# Patient Record
Sex: Female | Born: 2000 | Race: White | Hispanic: No | Marital: Married | Smoking: Never smoker
Health system: Southern US, Community
[De-identification: ages and names within clinical notes are randomized; demographics above are authoritative.]

## PROBLEM LIST (undated history)

## (undated) ENCOUNTER — Emergency Department

## (undated) DIAGNOSIS — J45909 Unspecified asthma, uncomplicated: Secondary | ICD-10-CM

## (undated) DIAGNOSIS — G4700 Insomnia, unspecified: Secondary | ICD-10-CM

## (undated) DIAGNOSIS — N83209 Unspecified ovarian cyst, unspecified side: Secondary | ICD-10-CM

## (undated) HISTORY — PX: TONSILLECTOMY: SUR1361

---
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
## 2018-02-03 ENCOUNTER — Other Ambulatory Visit: Payer: Self-pay

## 2018-02-03 ENCOUNTER — Emergency Department
Admission: EM | Admit: 2018-02-03 | Discharge: 2018-02-03 | Disposition: A | Discharge status: Home / Self Care | Payer: Medicaid Other | Attending: Emergency Medicine | Admitting: Emergency Medicine

## 2018-02-03 DIAGNOSIS — W2201XA Walked into wall, initial encounter: Secondary | ICD-10-CM | POA: Insufficient documentation

## 2018-02-03 DIAGNOSIS — Y990 Civilian activity done for income or pay: Secondary | ICD-10-CM | POA: Insufficient documentation

## 2018-02-03 DIAGNOSIS — S0093XA Contusion of unspecified part of head, initial encounter: Secondary | ICD-10-CM | POA: Insufficient documentation

## 2018-02-03 DIAGNOSIS — S0990XA Unspecified injury of head, initial encounter: Secondary | ICD-10-CM | POA: Diagnosis present

## 2018-02-03 DIAGNOSIS — Y929 Unspecified place or not applicable: Secondary | ICD-10-CM | POA: Diagnosis not present

## 2018-02-03 DIAGNOSIS — Y939 Activity, unspecified: Secondary | ICD-10-CM | POA: Insufficient documentation

## 2018-02-03 MED ORDER — KETOROLAC TROMETHAMINE 30 MG/ML IJ SOLN
30.0000 mg | Freq: Once | INTRAMUSCULAR | Status: AC
  Administered 2018-02-03: 30 mg via INTRAMUSCULAR
  Filled 2018-02-03: qty 1

## 2018-02-03 MED ORDER — KETOROLAC TROMETHAMINE 10 MG PO TABS: 10.0000 mg | ORAL_TABLET | Freq: Four times a day (QID) | ORAL | 0 refills | Status: AC | PRN

## 2018-02-03 NOTE — ED Provider Notes (Signed)
[facility] Emergency Department Provider Note  ____________________________________________  Time seen: Approximately 4:38 PM  I have reviewed the triage vital signs and the nursing notes.   HISTORY  Chief Complaint Head Injury   Historian Mother    HPI Destiny [name] is a 17 y.o. female presents to the emergency department with residual headache after patient reports that she hit her head against a brick wall yesterday at work.  Patient reports that she was walking when she tripped against a gutter and struck a wall.  Patient did not lose consciousness and is not complaining of neck pain.  Patient sustained no scalp abrasions or lacerations.  Patient has been ambulating without difficulty.  She denies any blurry vision, nausea, vomiting, disorientation or confusion.  Patient is accompanied by her mother who has noticed no changes in behavior.  Patient reports that she has had a headache since the incident that improves if not resolves with Tylenol.  Patient became concerned because when Tylenol wears off, headache seems to return.  Patient reports that headache is not the worst headache of her life.   Past Medical History:  Diagnosis Date  . Asthma   . Insomnia   . Ovarian cyst      Immunizations up to date:  Yes.     Past Medical History:  Diagnosis Date  . Asthma   . Insomnia   . Ovarian cyst     There are no active problems to display for this patient.   Past Surgical History:  Procedure Laterality Date  . TONSILLECTOMY      Prior to Admission medications   Medication Sig Start Date End Date Taking? Authorizing Provider  ketorolac (TORADOL) 10 MG tablet Take 1 tablet (10 mg total) by mouth every 6 (six) hours as needed for up to 5 days. 02/03/18 02/08/18  Orvil Feil, PA-C  meloxicam (MOBIC) 15 MG tablet Take 1 tablet (15 mg total) by mouth daily. 08/29/17   Cuthriell, Delorise Royals, PA-C  methocarbamol (ROBAXIN) 500 MG tablet Take 1 tablet  (500 mg total) by mouth 4 (four) times daily. 08/29/17   Cuthriell, Delorise Royals, PA-C  zolpidem (AMBIEN) 5 MG tablet Take 10 mg by mouth at bedtime as needed for sleep.    [provider]    Allergies Zofran [ondansetron hcl]  No family history on file.  Social History Social History   Tobacco Use  . Smoking status: Passive Smoke Exposure - Never Smoker  . Smokeless tobacco: Never Used  Substance Use Topics  . Alcohol use: No  . Drug use: No     Review of Systems  Constitutional: No fever/chills Eyes:  No discharge ENT: No upper respiratory complaints. Respiratory: no cough. No SOB/ use of accessory muscles to breath Gastrointestinal:   No nausea, no vomiting.  No diarrhea.  No constipation. Musculoskeletal: Negative for musculoskeletal pain. Skin: Negative for rash, abrasions, lacerations, ecchymosis.    ____________________________________________   PHYSICAL EXAM:  VITAL SIGNS: ED Triage Vitals  Enc Vitals Group     BP 02/03/18 1543 140/82     Pulse Rate 02/03/18 1543 85     Resp 02/03/18 1543 18     Temp 02/03/18 1543 98.3 F (36.8 C)     Temp Source 02/03/18 1543 Oral     SpO2 02/03/18 1543 95 %     Weight 02/03/18 1545 163 lb (73.9 kg)     Height 02/03/18 1545  (1.676 m)     Head Circumference --  Peak Flow --      Pain Score 02/03/18 1544 3     Pain Loc --      Pain Edu? --      Excl. in GC? --      Constitutional: Alert and oriented. Well appearing and in no acute distress. Eyes: Conjunctivae are normal. PERRL. EOMI. Head: Atraumatic.  No abrasions or scalp lacerations.  No palpable focal edema of the scalp appreciated. ENT:      Ears: TMs are pearly.      Nose: No congestion/rhinnorhea.      Mouth/Throat: Mucous membranes are moist.  Neck: No stridor.  No cervical spine tenderness to palpation.  Cardiovascular: Normal rate, regular rhythm. Normal S1 and S2.  Good peripheral circulation. Respiratory: Normal respiratory effort  without tachypnea or retractions. Lungs CTAB. Good air entry to the bases with no decreased or absent breath sounds Musculoskeletal: Full range of motion to all extremities. No obvious deformities noted Neurologic:  Normal for age. No gross focal neurologic deficits are appreciated.  Skin:  Skin is warm, dry and intact. No rash noted.  ____________________________________________   LABS (all labs ordered are listed, but only abnormal results are displayed)  Labs Reviewed - No data to display ____________________________________________  EKG   ____________________________________________  RADIOLOGY   No results found.  ____________________________________________    PROCEDURES  Procedure(s) performed:     Procedures     Medications  ketorolac (TORADOL) 30 MG/ML injection 30 mg (has no administration in time range)     ____________________________________________   INITIAL IMPRESSION / ASSESSMENT AND PLAN / ED COURSE  Pertinent labs & imaging results that were available during my care of the patient were reviewed by me and considered in my medical decision making (see chart for details).     Assessment and plan Fall Patient presents to the emergency department after tripping and hitting her head against a brick wall.  Neurologic exam was completely reassuring.  CT head is not warranted at this time.  Strict return precautions were given to return to the emergency department with excruciating headache, nausea, vomiting, new blurry vision or disorientation.  Patient's mother voiced understanding.  Patient received an injection of Toradol in the emergency department after she denies possibility of pregnancy.  She was discharged with Toradol.  Vital signs are reassuring prior to discharge.    ____________________________________________  FINAL CLINICAL IMPRESSION(S) / ED DIAGNOSES  Final diagnoses:  Contusion of head, unspecified part of head, initial  encounter      NEW MEDICATIONS STARTED DURING THIS VISIT:  ED Discharge Orders        Ordered    ketorolac (TORADOL) 10 MG tablet  Every 6 hours PRN     02/03/18 1635          This chart was dictated using voice recognition software/Dragon. Despite best efforts to proofread, errors can occur which can change the meaning. Any change was purely unintentional.     Orvil Feil, PA-C 02/03/18 1644    Don Perking, [place], MD 02/06/18 [phone]

## 2018-02-03 NOTE — ED Notes (Signed)
See triage note  States she fell yesterday   Hit head on brick wall  No loc but states she was stunned  conts to have headache  Neuro intact

## 2018-02-03 NOTE — ED Notes (Signed)
Pt ambulatory to POV without difficulty, VSS, NAD. Discharge instruction, RX, follow up reviewed. All questions addressed.

## 2018-02-03 NOTE — ED Triage Notes (Signed)
Pt fell, hit head on brick wall yesterday while at work. Headache since. No LOC. Pt alert and oriented X4, active, cooperative, pt in NAD. RR even and unlabored, color WNL.

## 2018-03-22 ENCOUNTER — Emergency Department: Payer: Medicaid Other

## 2018-03-22 ENCOUNTER — Emergency Department
Admission: EM | Admit: 2018-03-22 | Discharge: 2018-03-22 | Disposition: A | Discharge status: Home / Self Care | Payer: Medicaid Other | Attending: Emergency Medicine | Admitting: Emergency Medicine

## 2018-03-22 ENCOUNTER — Other Ambulatory Visit: Payer: Self-pay

## 2018-03-22 DIAGNOSIS — J45909 Unspecified asthma, uncomplicated: Secondary | ICD-10-CM | POA: Diagnosis not present

## 2018-03-22 DIAGNOSIS — R079 Chest pain, unspecified: Secondary | ICD-10-CM | POA: Insufficient documentation

## 2018-03-22 DIAGNOSIS — F4320 Adjustment disorder, unspecified: Secondary | ICD-10-CM | POA: Insufficient documentation

## 2018-03-22 DIAGNOSIS — Z7722 Contact with and (suspected) exposure to environmental tobacco smoke (acute) (chronic): Secondary | ICD-10-CM | POA: Diagnosis not present

## 2018-03-22 DIAGNOSIS — F419 Anxiety disorder, unspecified: Secondary | ICD-10-CM

## 2018-03-22 DIAGNOSIS — F4321 Adjustment disorder with depressed mood: Secondary | ICD-10-CM

## 2018-03-22 MED ORDER — LORAZEPAM 1 MG PO TABS: 1.0000 mg | ORAL_TABLET | Freq: Two times a day (BID) | ORAL | 0 refills | Status: DC

## 2018-03-22 MED ORDER — DIAZEPAM 5 MG PO TABS
10.0000 mg | ORAL_TABLET | Freq: Once | ORAL | Status: AC
  Administered 2018-03-22: 10 mg via ORAL
  Filled 2018-03-22: qty 2

## 2018-03-22 NOTE — ED Provider Notes (Addendum)
[facility] Emergency Department Provider Note       Time seen: ----------------------------------------- 9:29 AM on 03/22/2018 -----------------------------------------   I have reviewed the triage vital signs and the nursing notes.  HISTORY   Chief Complaint Chest Pain    HPI Destiny [name] is a 17 y.o. female with a history of asthma, insomnia and ovarian cysts who presents to the ED for chest pain in the center of her chest for the last 4 days.  She has chest tightness but no cough.  She denies any recent illness, fevers, chills or other complaints.  She states she lost her grandmother in April and another family member 4 days ago.  Past Medical History:  Diagnosis Date  . Asthma   . Insomnia   . Ovarian cyst     There are no active problems to display for this patient.   Past Surgical History:  Procedure Laterality Date  . TONSILLECTOMY      Allergies Zofran Frazier Richards hcl]  Social History Social History   Tobacco Use  . Smoking status: Passive Smoke Exposure - Never Smoker  . Smokeless tobacco: Never Used  Substance Use Topics  . Alcohol use: No  . Drug use: No   Review of Systems Constitutional: Negative for fever. Cardiovascular: Positive for chest pain Respiratory: Negative for shortness of breath.  Negative for cough Gastrointestinal: Negative for abdominal pain, vomiting and diarrhea. Musculoskeletal: Negative for back pain. Skin: Negative for rash. Neurological: Negative for headaches, focal weakness or numbness. Psychiatric: Positive for anxiety and stress  All systems negative/normal/unremarkable except as stated in the HPI  ____________________________________________   PHYSICAL EXAM:  VITAL SIGNS: ED Triage Vitals [03/22/18 0919]  Enc Vitals Group     BP (!) 155/83     Pulse Rate 96     Resp 14     Temp 99.1 F (37.3 C)     Temp Source Oral     SpO2 98 %     Weight 161 lb (73 kg)     Height 5\' 5"   (1.651 m)     Head Circumference      Peak Flow      Pain Score 6     Pain Loc      Pain Edu?      Excl. in GC?    Constitutional: Alert and oriented. Well appearing and in no distress. Eyes: Conjunctivae are normal. Normal extraocular movements. Cardiovascular: Normal rate, regular rhythm. No murmurs, rubs, or gallops. Respiratory: Normal respiratory effort without tachypnea nor retractions. Breath sounds are clear and equal bilaterally. No wheezes/rales/rhonchi. Gastrointestinal: Soft and nontender. Normal bowel sounds Musculoskeletal: Nontender with normal range of motion in extremities. No lower extremity tenderness nor edema. Neurologic:  Normal speech and language. No gross focal neurologic deficits are appreciated.  Skin:  Skin is warm, dry and intact. No rash noted. Psychiatric: Depressed mood and affect, patient is tearful ____________________________________________  EKG: Interpreted by me.  Sinus rhythm the rate of 100 bpm, normal PR interval, normal QRS, normal QT  ____________________________________________  ED COURSE:  As part of my medical decision making, I reviewed the following data within the electronic MEDICAL RECORD NUMBER History obtained from family if available, nursing notes, old chart and ekg, as well as notes from prior ED visits. Patient presented for chest pain, we will assess with labs and imaging as indicated at this time.   Procedures ____________________________________________   RADIOLOGY Images were viewed by me  Chest x-ray Is unremarkable ____________________________________________  DIFFERENTIAL DIAGNOSIS   Asthma, anxiety, acute grief reaction, GERD  FINAL ASSESSMENT AND PLAN  Chest pain   Plan: The patient had presented for chest pain. Patient's imaging did not reveal any acute process.  This appears to be a grief reaction and she was given p.o. Valium here.  She will be discharged with a short supply of similar benzodiazepine.  She  is cleared for outpatient follow-up but does need to see a counselor soon.   Ulice DashJohnathan E Darria Corvera, MD   Note: This note was generated in part or whole with voice recognition software. Voice recognition is usually quite accurate but there are transcription errors that can and very often do occur. I apologize for any typographical errors that were not detected and corrected.     Emily FilbertWilliams, Bria Sparr E, MD 03/22/18 [id number]    Emily FilbertWilliams, Gates Jividen E, MD 03/22/18 [phone]

## 2018-03-22 NOTE — ED Triage Notes (Signed)
Chest pain to center of chest X 4 days, tightness and cough. Denies cough or recent sickness. Pt alert and oriented X4, active, cooperative, pt in NAD. RR even and unlabored, color WNL.

## 2018-03-22 NOTE — ED Notes (Signed)
Pt taken to Xray at this time.

## 2018-06-13 ENCOUNTER — Other Ambulatory Visit: Payer: Self-pay

## 2018-06-13 ENCOUNTER — Encounter: Payer: Self-pay | Admitting: Emergency Medicine

## 2018-06-13 ENCOUNTER — Emergency Department
Admission: EM | Admit: 2018-06-13 | Discharge: 2018-06-13 | Disposition: A | Discharge status: Home / Self Care | Payer: Medicaid Other | Attending: Emergency Medicine | Admitting: Emergency Medicine

## 2018-06-13 DIAGNOSIS — Z79899 Other long term (current) drug therapy: Secondary | ICD-10-CM | POA: Diagnosis not present

## 2018-06-13 DIAGNOSIS — F32A Depression, unspecified: Secondary | ICD-10-CM

## 2018-06-13 DIAGNOSIS — F1290 Cannabis use, unspecified, uncomplicated: Secondary | ICD-10-CM | POA: Diagnosis not present

## 2018-06-13 DIAGNOSIS — J45909 Unspecified asthma, uncomplicated: Secondary | ICD-10-CM | POA: Diagnosis not present

## 2018-06-13 DIAGNOSIS — Z7722 Contact with and (suspected) exposure to environmental tobacco smoke (acute) (chronic): Secondary | ICD-10-CM | POA: Diagnosis not present

## 2018-06-13 DIAGNOSIS — F329 Major depressive disorder, single episode, unspecified: Secondary | ICD-10-CM | POA: Diagnosis not present

## 2018-06-13 LAB — URINALYSIS, COMPLETE (UACMP) WITH MICROSCOPIC
BILIRUBIN URINE: NEGATIVE
GLUCOSE, UA: NEGATIVE mg/dL
Hgb urine dipstick: NEGATIVE
KETONES UR: NEGATIVE mg/dL
Leukocytes, UA: NEGATIVE
NITRITE: NEGATIVE
PH: 6 (ref 5.0–8.0)
Protein, ur: NEGATIVE mg/dL
Specific Gravity, Urine: 1.026 (ref 1.005–1.030)

## 2018-06-13 LAB — POCT PREGNANCY, URINE: Preg Test, Ur: NEGATIVE

## 2018-06-13 NOTE — Discharge Instructions (Signed)
You are suffering from depression from complicated reading.  This is not unnatural, but you should seek help with a counselor.  If you have any thoughts of hurting herself or anyone else as we discussed please return immediately to medical attention.  Otherwise, be aware that usually would benefit from seeing a professional about this.  We have advised to do some places to follow-up with your primary care doctor can also help with this.  We have also had our counselor gave you some resources.  If you have any other concerns return to the emergency room

## 2018-06-13 NOTE — ED Triage Notes (Signed)
Arrives c/o "feeling weak and not being able to sleep well".  States symptoms have been ongoing for months.  AAOx3.  Skin warm and dry. NAD

## 2018-06-13 NOTE — ED Notes (Signed)
Pt reports frequent headaches and dizziness. Pt reports starting a new medication, Ambien, for her sleeplessness that is causing her to feel weak.

## 2018-06-13 NOTE — ED Provider Notes (Signed)
[facility] Emergency Department Provider Note  ____________________________________________   I have reviewed the triage vital signs and the nursing notes. Where available I have reviewed prior notes and, if possible and indicated, outside hospital notes.    HISTORY  Chief Complaint Weakness    HPI Destiny [name] is a 17 y.o. female who presents today complaining of depression.  She states she has easy tearful moments, difficulty sleeping, and anhedonia and trouble focusing since her grandmother died unexpectedly in this.  She was seen here for grief reaction shortly thereafter.  She has no SI or HI she is not being abused she is with family she is not taking it or abusing any drugs does have occasional marijuana she does not drink, she has no thoughts of hurting herself or anyone else.  She thinks however that she needs some help with her depression.  Her girlfriend who is with her agrees.  Patient did give consent to talk about her problems in front of her girlfriend.  Most of patient's history was taken up by very intimate details of the patient's grandmother's medical history in the last year of her life with multiple hospitalizations and medical information.     Past Medical History:  Diagnosis Date  . Asthma   . Insomnia   . Ovarian cyst     There are no active problems to display for this patient.   Past Surgical History:  Procedure Laterality Date  . TONSILLECTOMY      Prior to Admission medications   Medication Sig Start Date End Date Taking? Authorizing Provider  etonogestrel (NEXPLANON) 68 MG IMPL implant 1 each by Subdermal route once.    [provider]  LORazepam (ATIVAN) 1 MG tablet Take 1 tablet (1 mg total) by mouth 2 (two) times daily. 03/22/18 03/22/19  Emily Filbert, MD  meloxicam (MOBIC) 15 MG tablet Take 1 tablet (15 mg total) by mouth daily. Patient not taking: Reported on 03/22/2018 08/29/17   Cuthriell, Delorise Royals, PA-C   methocarbamol (ROBAXIN) 500 MG tablet Take 1 tablet (500 mg total) by mouth 4 (four) times daily. Patient not taking: Reported on 03/22/2018 08/29/17   Cuthriell, Delorise Royals, PA-C    Allergies Zofran Frazier Richards hcl]  No family history on file.  Social History Social History   Tobacco Use  . Smoking status: Passive Smoke Exposure - Never Smoker  . Smokeless tobacco: Never Used  Substance Use Topics  . Alcohol use: No  . Drug use: No    Review of Systems Constitutional: No fever/chills Eyes: No visual changes. ENT: No sore throat. No stiff neck no neck pain Cardiovascular: Denies chest pain. Respiratory: Denies shortness of breath. Gastrointestinal:   no vomiting.  No diarrhea.  No constipation. Genitourinary: Negative for dysuria. Musculoskeletal: Negative lower extremity swelling Skin: Negative for rash. Neurological: Negative for severe headaches, focal weakness or numbness.   ____________________________________________   PHYSICAL EXAM:  VITAL SIGNS: ED Triage Vitals  Enc Vitals Group     BP 06/13/18 1358 129/79     Pulse Rate 06/13/18 1358 95     Resp 06/13/18 1358 16     Temp 06/13/18 1358 98.6 F (37 C)     Temp Source 06/13/18 1358 Oral     SpO2 06/13/18 1358 97 %     Weight 06/13/18 1356 163 lb (73.9 kg)     Height 06/13/18 1356 5' 5.5" (1.664 m)     Head Circumference --      Peak Flow --  Pain Score 06/13/18 1356 0     Pain Loc --      Pain Edu? --      Excl. in GC? --     Constitutional: Alert and oriented. Well appearing and in no acute distress. Eyes: Conjunctivae are normal Head: Atraumatic HEENT: No congestion/rhinnorhea. Mucous membranes are moist.  Oropharynx non-erythematous Neck:   Nontender with no meningismus, no masses, no stridor Cardiovascular: Normal rate, regular rhythm. Grossly normal heart sounds.  Good peripheral circulation. Respiratory: Normal respiratory effort.  No retractions. Lungs CTAB. Abdominal: Soft and  nontender. No distention. No guarding no rebound Back:  There is no focal tenderness or step off.  there is no midline tenderness there are no lesions noted. there is no CVA tenderness Musculoskeletal: No lower extremity tenderness, no upper extremity tenderness. No joint effusions, no DVT signs strong distal pulses no edema Neurologic:  Normal speech and language. No gross focal neurologic deficits are appreciated.  Skin:  Skin is warm, dry and intact. No rash noted. Psychiatric: Mood and affect are somewhat depressed although able to have laughter and changing moods. Speech and behavior are normal.  ____________________________________________   LABS (all labs ordered are listed, but only abnormal results are displayed)  Labs Reviewed  URINALYSIS, COMPLETE (UACMP) WITH MICROSCOPIC - Abnormal; Notable for the following components:      Result Value   Color, Urine YELLOW (*)    APPearance CLEAR (*)    Bacteria, UA RARE (*)    All other components within normal limits  POCT PREGNANCY, URINE  POC URINE PREG, ED    Pertinent labs  results that were available during my care of the patient were reviewed by me and considered in my medical decision making (see chart for details). ____________________________________________  EKG  I personally interpreted any EKGs ordered by me or triage  ____________________________________________  RADIOLOGY  Pertinent labs & imaging results that were available during my care of the patient were reviewed by me and considered in my medical decision making (see chart for details). If possible, patient and/or family made aware of any abnormal findings.  No results found. ____________________________________________    PROCEDURES  Procedure(s) performed: None  Procedures  Critical Care performed: None  ____________________________________________   INITIAL IMPRESSION / ASSESSMENT AND PLAN / ED COURSE  Pertinent labs & imaging results that  were available during my care of the patient were reviewed by me and considered in my medical decision making (see chart for details).  Patient with signs and symptoms of depression medically speaking I do not see any acute injury or evidence of Janak disease at this time, such as thyroid etc.  However, patient does have evidence of depression.  She has no SI or HI, she does require outpatient follow-up, she is not being abused she is not abusing drugs or alcohol to any significant extent.  I have counseled her about marijuana use.  I will have the TTS counselors give her resources.  I have offered her condolences in consultation to the extent that I can hear.  Prolonged adjustment reaction    ____________________________________________   FINAL CLINICAL IMPRESSION(S) / ED DIAGNOSES  Final diagnoses:  None      This chart was dictated using voice recognition software.  Despite best efforts to proofread,  errors can occur which can change meaning.      Jeanmarie PlantMcShane, Tarren Sabree A, MD 06/13/18 [phone]

## 2018-06-15 ENCOUNTER — Emergency Department
Admission: EM | Admit: 2018-06-15 | Discharge: 2018-06-15 | Disposition: A | Discharge status: Home / Self Care | Payer: Medicaid Other | Attending: Emergency Medicine | Admitting: Emergency Medicine

## 2018-06-15 ENCOUNTER — Other Ambulatory Visit: Payer: Self-pay

## 2018-06-15 ENCOUNTER — Encounter: Payer: Self-pay | Admitting: Emergency Medicine

## 2018-06-15 DIAGNOSIS — R5383 Other fatigue: Secondary | ICD-10-CM | POA: Diagnosis present

## 2018-06-15 DIAGNOSIS — F1210 Cannabis abuse, uncomplicated: Secondary | ICD-10-CM | POA: Insufficient documentation

## 2018-06-15 DIAGNOSIS — Z7722 Contact with and (suspected) exposure to environmental tobacco smoke (acute) (chronic): Secondary | ICD-10-CM | POA: Insufficient documentation

## 2018-06-15 DIAGNOSIS — F1290 Cannabis use, unspecified, uncomplicated: Secondary | ICD-10-CM

## 2018-06-15 DIAGNOSIS — J45909 Unspecified asthma, uncomplicated: Secondary | ICD-10-CM | POA: Insufficient documentation

## 2018-06-15 DIAGNOSIS — Z79899 Other long term (current) drug therapy: Secondary | ICD-10-CM | POA: Insufficient documentation

## 2018-06-15 DIAGNOSIS — Z634 Disappearance and death of family member: Secondary | ICD-10-CM | POA: Diagnosis not present

## 2018-06-15 DIAGNOSIS — F4321 Adjustment disorder with depressed mood: Secondary | ICD-10-CM

## 2018-06-15 LAB — URINE DRUG SCREEN, QUALITATIVE (ARMC ONLY)
Amphetamines, Ur Screen: NOT DETECTED
Barbiturates, Ur Screen: NOT DETECTED
Benzodiazepine, Ur Scrn: NOT DETECTED
CANNABINOID 50 NG, UR ~~LOC~~: POSITIVE — AB
COCAINE METABOLITE, UR ~~LOC~~: NOT DETECTED
MDMA (Ecstasy)Ur Screen: NOT DETECTED
Methadone Scn, Ur: NOT DETECTED
OPIATE, UR SCREEN: NOT DETECTED
Phencyclidine (PCP) Ur S: NOT DETECTED
TRICYCLIC, UR SCREEN: NOT DETECTED

## 2018-06-15 LAB — POCT PREGNANCY, URINE: Preg Test, Ur: NEGATIVE

## 2018-06-15 NOTE — ED Notes (Signed)
Pt here with friend that reports pt smoked "weed" last night and "started tripping". Pt called 911 and was told that she was having a panic attack and to sleep it off. Pt states that she woke up this am and is still feeling the same. Pt denies taking any medications.

## 2018-06-15 NOTE — ED Notes (Signed)
AAOx3.  Skin warm and dry.  NAD 

## 2018-06-15 NOTE — ED Triage Notes (Signed)
Patient reports using marijuana last night and immediately becoming extremely tired and anxious. Reports she was checked out by EMS last night and told her vitals were good and to "sleep it off" and come be evaluated today if symptoms not any better. Per friend, this was the first time that they had bought from this person, however friend smoked from same batch and has had no symptoms. Patient appears sleepy in triage but easily aroused and alert and oriented.

## 2018-06-15 NOTE — ED Provider Notes (Signed)
[facility] Emergency Department Provider Note  ____________________________________________   I have reviewed the triage vital signs and the nursing notes.   HISTORY  Chief Complaint Fatigue  History limited by: Not Limited   HPI Destiny [name] is a 17 y.o. female who presents to the emergency department today with concerns for fatigue.  The patient states that she smoked marijuana last night and then had a bad reaction.  Girlfriend stated that she had a panic attack.  Patient then did not get to bed until roughly 7 AM.  Woke up at 10:00.  EMS was called to the scene last night and states she was having a panic attack and that she should try to sleep.  She comes in today because she continues to feel fatigued and weak.  She is dealing with some grief related to the death of her grandmother.  Does have issues of insomnia and was recently prescribed Ambien  Per medical record review patient has a history of insomnia, asthma.   Past Medical History:  Diagnosis Date  . Asthma   . Insomnia   . Ovarian cyst     There are no active problems to display for this patient.   Past Surgical History:  Procedure Laterality Date  . TONSILLECTOMY      Prior to Admission medications   Medication Sig Start Date End Date Taking? Authorizing Provider  etonogestrel (NEXPLANON) 68 MG IMPL implant 1 each by Subdermal route once.    [provider]  LORazepam (ATIVAN) 1 MG tablet Take 1 tablet (1 mg total) by mouth 2 (two) times daily. 03/22/18 03/22/19  Emily Filbert, MD  meloxicam (MOBIC) 15 MG tablet Take 1 tablet (15 mg total) by mouth daily. Patient not taking: Reported on 03/22/2018 08/29/17   Cuthriell, Delorise Royals, PA-C  methocarbamol (ROBAXIN) 500 MG tablet Take 1 tablet (500 mg total) by mouth 4 (four) times daily. Patient not taking: Reported on 03/22/2018 08/29/17   Cuthriell, Delorise Royals, PA-C    Allergies Zofran Frazier Richards hcl]  No family history on  file.  Social History Social History   Tobacco Use  . Smoking status: Passive Smoke Exposure - Never Smoker  . Smokeless tobacco: Never Used  Substance Use Topics  . Alcohol use: No  . Drug use: No    Review of Systems Constitutional: Positive for fatigue. Eyes: No visual changes. ENT: No sore throat. Cardiovascular: Denies chest pain. Respiratory: Denies shortness of breath. Gastrointestinal: No abdominal pain.  No nausea, no vomiting.  No diarrhea.   Genitourinary: Negative for dysuria. Musculoskeletal: Negative for back pain. Skin: Negative for rash. Neurological: Negative for headaches, focal weakness or numbness.  ____________________________________________   PHYSICAL EXAM:  VITAL SIGNS: ED Triage Vitals  Enc Vitals Group     BP 06/15/18 1504 132/74     Pulse Rate 06/15/18 1504 74     Resp --      Temp 06/15/18 1504 98.4 F (36.9 C)     Temp Source 06/15/18 1504 Oral     SpO2 06/15/18 1504 98 %     Weight 06/15/18 1525 163 lb 2.3 oz (74 kg)     Height 06/15/18 1525 5\' 5"  (1.651 m)     Head Circumference --      Peak Flow --      Pain Score 06/15/18 1524 7   Constitutional: Alert and oriented.  Eyes: Conjunctivae are normal.  ENT      Head: Normocephalic and atraumatic.  Nose: No congestion/rhinnorhea.      Mouth/Throat: Mucous membranes are moist.      Neck: No stridor. Hematological/Lymphatic/Immunilogical: No cervical lymphadenopathy. Cardiovascular: Normal rate, regular rhythm.  No murmurs, rubs, or gallops.  Respiratory: Normal respiratory effort without tachypnea nor retractions. Breath sounds are clear and equal bilaterally. No wheezes/rales/rhonchi. Gastrointestinal: Soft and non tender. No rebound. No guarding.  Genitourinary: Deferred Musculoskeletal: Normal range of motion in all extremities. No lower extremity edema. Neurologic:  Normal speech and language. No gross focal neurologic deficits are appreciated.  Skin:  Skin is warm, dry  and intact. No rash noted. Psychiatric: Mood and affect are normal. Speech and behavior are normal. Patient exhibits appropriate insight and judgment.  ____________________________________________    LABS (pertinent positives/negatives)  Upreg negative UDS positive cannabinoid  ____________________________________________   EKG  None  ____________________________________________    RADIOLOGY  None  ____________________________________________   PROCEDURES  Procedures  ____________________________________________   INITIAL IMPRESSION / ASSESSMENT AND PLAN / ED COURSE  Pertinent labs & imaging results that were available during my care of the patient were reviewed by me and considered in my medical decision making (see chart for details).   Presented to the emergency department today because of concerns for fatigue.  At this point I think likely the fatigue is secondary to poor sleep and marijuana use.  Patient also experiencing some grief.  Discussed these with the patient.  Did offer blood work however I think it would be of low value.  Did recommend patient follow-up with grief counselors.  Did encourage cessation of drug use  ____________________________________________   FINAL CLINICAL IMPRESSION(S) / ED DIAGNOSES  Final diagnoses:  Fatigue, unspecified type  Grief  Marijuana use     Note: This dictation was prepared with Dragon dictation. Any transcriptional errors that result from this process are unintentional     Phineas SemenGoodman, Elianny Buxbaum, MD 06/15/18 [phone]

## 2018-06-15 NOTE — Discharge Instructions (Addendum)
Please seek medical attention for any high fevers, chest pain, shortness of breath, change in behavior, persistent vomiting, bloody stool or any other new or concerning symptoms.  

## 2018-06-19 ENCOUNTER — Encounter: Payer: Self-pay | Admitting: Emergency Medicine

## 2018-06-19 ENCOUNTER — Other Ambulatory Visit: Payer: Self-pay

## 2018-06-19 ENCOUNTER — Emergency Department
Admission: EM | Admit: 2018-06-19 | Discharge: 2018-06-19 | Disposition: A | Discharge status: Home / Self Care | Payer: Medicaid Other | Attending: Emergency Medicine | Admitting: Emergency Medicine

## 2018-06-19 DIAGNOSIS — R42 Dizziness and giddiness: Secondary | ICD-10-CM

## 2018-06-19 DIAGNOSIS — J45909 Unspecified asthma, uncomplicated: Secondary | ICD-10-CM | POA: Insufficient documentation

## 2018-06-19 DIAGNOSIS — Z79899 Other long term (current) drug therapy: Secondary | ICD-10-CM | POA: Insufficient documentation

## 2018-06-19 DIAGNOSIS — Z7722 Contact with and (suspected) exposure to environmental tobacco smoke (acute) (chronic): Secondary | ICD-10-CM | POA: Insufficient documentation

## 2018-06-19 LAB — CBC
HEMATOCRIT: 42.7 % (ref 35.0–47.0)
Hemoglobin: 14.6 g/dL (ref 12.0–16.0)
MCH: 30 pg (ref 26.0–34.0)
MCHC: 34.2 g/dL (ref 32.0–36.0)
MCV: 87.6 fL (ref 80.0–100.0)
PLATELETS: 328 10*3/uL (ref 150–440)
RBC: 4.88 MIL/uL (ref 3.80–5.20)
RDW: 13 % (ref 11.5–14.5)
WBC: 12.6 10*3/uL — ABNORMAL HIGH (ref 3.6–11.0)

## 2018-06-19 LAB — BASIC METABOLIC PANEL
Anion gap: 7 (ref 5–15)
BUN: 14 mg/dL (ref 6–20)
CO2: 27 mmol/L (ref 22–32)
CREATININE: 0.62 mg/dL (ref 0.44–1.00)
Calcium: 9.6 mg/dL (ref 8.9–10.3)
Chloride: 107 mmol/L (ref 98–111)
GFR calc Af Amer: >60 mL/min (ref >60)
GFR calc non Af Amer: >60 mL/min (ref >60)
GLUCOSE: 106 mg/dL — AB (ref 70–99)
Potassium: 4 mmol/L (ref 3.5–5.1)
Sodium: 141 mmol/L (ref 135–145)

## 2018-06-19 LAB — URINALYSIS, COMPLETE (UACMP) WITH MICROSCOPIC
Bilirubin Urine: NEGATIVE
Glucose, UA: NEGATIVE mg/dL
Hgb urine dipstick: NEGATIVE
Ketones, ur: NEGATIVE mg/dL
Leukocytes, UA: NEGATIVE
Nitrite: NEGATIVE
PH: 6 (ref 5.0–8.0)
Protein, ur: NEGATIVE mg/dL
Specific Gravity, Urine: 1.02 (ref 1.005–1.030)

## 2018-06-19 LAB — PREGNANCY, URINE: Preg Test, Ur: NEGATIVE

## 2018-06-19 MED ORDER — HYDROXYZINE HCL 25 MG PO TABS: 25.0000 mg | ORAL_TABLET | Freq: Three times a day (TID) | ORAL | 0 refills | Status: DC | PRN

## 2018-06-19 NOTE — ED Provider Notes (Signed)
[facility] Emergency Department Provider Note  Time seen: 2:52 AM  I have reviewed the triage vital signs and the nursing notes.   HISTORY  Chief Complaint Dizziness    HPI Destiny [name] is a 17 y.o. female with a past medical history of asthma presents to the emergency department for dizziness and not feeling well.  According to the patient over the past 7 to 8 days she has been somewhat dizzy and lightheaded, and states she has not been feeling very well, having trouble focusing.  Denies any pain.  Largely negative review of systems.  Patient states she has had similar symptoms in the past and has been told it could be anxiety or depression related but is not currently seeing anyone or taking any medications.   Past Medical History:  Diagnosis Date  . Asthma   . Insomnia   . Ovarian cyst     There are no active problems to display for this patient.   Past Surgical History:  Procedure Laterality Date  . TONSILLECTOMY      Prior to Admission medications   Medication Sig Start Date End Date Taking? Authorizing Provider  etonogestrel (NEXPLANON) 68 MG IMPL implant 1 each by Subdermal route once.    [provider]  LORazepam (ATIVAN) 1 MG tablet Take 1 tablet (1 mg total) by mouth 2 (two) times daily. 03/22/18 03/22/19  Emily Filbert, MD  meloxicam (MOBIC) 15 MG tablet Take 1 tablet (15 mg total) by mouth daily. Patient not taking: Reported on 03/22/2018 08/29/17   Cuthriell, Delorise Royals, PA-C  methocarbamol (ROBAXIN) 500 MG tablet Take 1 tablet (500 mg total) by mouth 4 (four) times daily. Patient not taking: Reported on 03/22/2018 08/29/17   Cuthriell, Delorise Royals, PA-C    Allergies  Allergen Reactions  . Lorazepam Other (See Comments)    Feels funny  . Zofran [Ondansetron Hcl] Hives and Itching  . Zolpidem Other (See Comments)    Feels funny    No family history on file.  Social History Social History   Tobacco Use  . Smoking  status: Passive Smoke Exposure - Never Smoker  . Smokeless tobacco: Never Used  Substance Use Topics  . Alcohol use: No  . Drug use: No    Review of Systems Constitutional: Negative for fever. Cardiovascular: Negative for chest pain. Respiratory: Negative for shortness of breath. Gastrointestinal: Negative for abdominal pain, vomiting  Genitourinary: Negative for urinary compaints Musculoskeletal: Negative for musculoskeletal complaints Skin: Negative for skin complaints  Neurological: Negative for headache All other ROS negative  ____________________________________________   PHYSICAL EXAM:  VITAL SIGNS: ED Triage Vitals  Enc Vitals Group     BP 06/19/18 0029 (!) 149/77     Pulse Rate 06/19/18 0029 88     Resp 06/19/18 0029 18     Temp 06/19/18 0029 98.5 F (36.9 C)     Temp Source 06/19/18 0029 Oral     SpO2 06/19/18 0029 99 %     Weight 06/19/18 0029 163 lb (73.9 kg)     Height 06/19/18 0029 5\' 5"  (1.651 m)     Head Circumference --      Peak Flow --      Pain Score 06/19/18 0043 0     Pain Loc --      Pain Edu? --      Excl. in GC? --    Constitutional: Alert and oriented. Well appearing and in no distress. Eyes: Normal exam ENT  Head: Normocephalic and atraumatic.   Mouth/Throat: Mucous membranes are moist. Cardiovascular: Normal rate, regular rhythm. No murmur Respiratory: Normal respiratory effort without tachypnea nor retractions. Breath sounds are clear  Gastrointestinal: Soft and nontender. No distention. Musculoskeletal: Nontender with normal range of motion in all extremities.  Neurologic:  Normal speech and language. No gross focal neurologic deficits  Skin:  Skin is warm, dry and intact.  Psychiatric: Mood and affect are normal.  ____________________________________________    EKG  EKG reviewed and interpreted by myself shows normal sinus rhythm 81 bpm with a narrow QRS, normal axis, normal intervals, no ST  changes.  ____________________________________________   INITIAL IMPRESSION / ASSESSMENT AND PLAN / ED COURSE  Pertinent labs & imaging results that were available during my care of the patient were reviewed by me and considered in my medical decision making (see chart for details).  Patient presents to the emergency department with symptoms of intermittent dizziness, lightheaded feeling at times, difficulty focusing and paying attention.  Differential is quite broad but would include infectious etiologies, metabolic or electrolyte abnormalities, anxiety.  Patient's medical work-up is largely nonrevealing, blood work and urinalysis are reassuring, EKG is reassuring.  I did discuss the possibility of anxiety.  We will discharge with a course of hydroxyzine to see if this helps with her symptoms on a as needed basis.  Patient agreeable to plan of care.  ____________________________________________   FINAL CLINICAL IMPRESSION(S) / ED DIAGNOSES  Dizziness    Minna Antis, MD 06/19/18 [phone]

## 2018-06-19 NOTE — ED Triage Notes (Signed)
Pt arrives ambulatory with steady gait to triage with c/o dizziness x1 week. Pt is in NAD.

## 2018-09-07 ENCOUNTER — Other Ambulatory Visit: Payer: Self-pay

## 2018-09-07 ENCOUNTER — Emergency Department
Admission: EM | Admit: 2018-09-07 | Discharge: 2018-09-07 | Disposition: A | Discharge status: Home / Self Care | Payer: Medicaid Other | Attending: Emergency Medicine | Admitting: Emergency Medicine

## 2018-09-07 ENCOUNTER — Encounter: Payer: Self-pay | Admitting: Emergency Medicine

## 2018-09-07 DIAGNOSIS — J45909 Unspecified asthma, uncomplicated: Secondary | ICD-10-CM | POA: Insufficient documentation

## 2018-09-07 DIAGNOSIS — Z79899 Other long term (current) drug therapy: Secondary | ICD-10-CM | POA: Insufficient documentation

## 2018-09-07 DIAGNOSIS — Z7722 Contact with and (suspected) exposure to environmental tobacco smoke (acute) (chronic): Secondary | ICD-10-CM | POA: Insufficient documentation

## 2018-09-07 DIAGNOSIS — R102 Pelvic and perineal pain: Secondary | ICD-10-CM | POA: Diagnosis present

## 2018-09-07 LAB — BASIC METABOLIC PANEL
Anion gap: 9 (ref 5–15)
BUN: 12 mg/dL (ref 6–20)
CALCIUM: 8.7 mg/dL — AB (ref 8.9–10.3)
CO2: 21 mmol/L — AB (ref 22–32)
Chloride: 109 mmol/L (ref 98–111)
Creatinine, Ser: 0.6 mg/dL (ref 0.44–1.00)
GFR calc Af Amer: >60 mL/min (ref >60)
Glucose, Bld: 144 mg/dL — ABNORMAL HIGH (ref 70–99)
Potassium: 3.2 mmol/L — ABNORMAL LOW (ref 3.5–5.1)
Sodium: 139 mmol/L (ref 135–145)

## 2018-09-07 LAB — CBC
HEMATOCRIT: 43.3 % (ref 36.0–46.0)
Hemoglobin: 14.2 g/dL (ref 12.0–15.0)
MCH: 29.5 pg (ref 26.0–34.0)
MCHC: 32.8 g/dL (ref 30.0–36.0)
MCV: 90 fL (ref 80.0–100.0)
PLATELETS: 271 10*3/uL (ref 150–400)
RBC: 4.81 MIL/uL (ref 3.87–5.11)
RDW: 12.2 % (ref 11.5–15.5)
WBC: 18.1 10*3/uL — ABNORMAL HIGH (ref 4.0–10.5)
nRBC: 0 % (ref 0.0–0.2)

## 2018-09-07 LAB — POCT PREGNANCY, URINE: Preg Test, Ur: NEGATIVE

## 2018-09-07 NOTE — ED Provider Notes (Signed)
[facility] Emergency Department Provider Note  ____________________________________________   I have reviewed the triage vital signs and the nursing notes.   HISTORY  Chief Complaint Pelvic Pain   History limited by: Not Limited   HPI Destiny [name] is a 17 y.o. female who presents to the emergency department today because of concerns for abdominal pain.  Is located in the suprapubic region.  She states that it started suddenly.  It was severe.  It lasted for about 30 minutes.  She states that she has had similar pain in the past when she has had ovarian cysts.  She had been on birth control which did help however she ran out roughly 1 month ago.  Patient denies any abnormal vaginal discharge.  She denies any change in defecation or urination.  No fevers.  No nausea or vomiting.   Per medical record review patient has a history of ovarian cyst.   Past Medical History:  Diagnosis Date  . Asthma   . Insomnia   . Ovarian cyst     There are no active problems to display for this patient.   Past Surgical History:  Procedure Laterality Date  . TONSILLECTOMY      Prior to Admission medications   Medication Sig Start Date End Date Taking? Authorizing Provider  etonogestrel (NEXPLANON) 68 MG IMPL implant 1 each by Subdermal route once.    [provider]  hydrOXYzine (ATARAX/VISTARIL) 25 MG tablet Take 1 tablet (25 mg total) by mouth 3 (three) times daily as needed for anxiety. 06/19/18   Minna Antis, MD  LORazepam (ATIVAN) 1 MG tablet Take 1 tablet (1 mg total) by mouth 2 (two) times daily. 03/22/18 03/22/19  Emily Filbert, MD  meloxicam (MOBIC) 15 MG tablet Take 1 tablet (15 mg total) by mouth daily. Patient not taking: Reported on 03/22/2018 08/29/17   Cuthriell, Delorise Royals, PA-C  methocarbamol (ROBAXIN) 500 MG tablet Take 1 tablet (500 mg total) by mouth 4 (four) times daily. Patient not taking: Reported on 03/22/2018 08/29/17   Cuthriell,  Delorise Royals, PA-C    Allergies Lorazepam; Zofran Frazier Richards hcl]; and Zolpidem  No family history on file.  Social History Social History   Tobacco Use  . Smoking status: Passive Smoke Exposure - Never Smoker  . Smokeless tobacco: Never Used  Substance Use Topics  . Alcohol use: No  . Drug use: No    Review of Systems Constitutional: No fever/chills Eyes: No visual changes. ENT: No sore throat. Cardiovascular: Denies chest pain. Respiratory: Denies shortness of breath. Gastrointestinal: Positive for lower abdominal pain.  Genitourinary: Negative for dysuria. Musculoskeletal: Negative for back pain. Skin: Negative for rash. Neurological: Negative for headaches, focal weakness or numbness.  ____________________________________________   PHYSICAL EXAM:  VITAL SIGNS: ED Triage Vitals [09/07/18 1744]  Enc Vitals Group     BP 114/71     Pulse Rate (!) 111     Resp 18     Temp 98.5 F (36.9 C)     Temp Source Oral     SpO2 96 %     Weight 163 lb (73.9 kg)     Height 5\' 5"  (1.651 m)     Head Circumference      Peak Flow      Pain Score 5   Constitutional: Alert and oriented.  Eyes: Conjunctivae are normal.  ENT      Head: Normocephalic and atraumatic.      Nose: No congestion/rhinnorhea.  Mouth/Throat: Mucous membranes are moist.      Neck: No stridor. Hematological/Lymphatic/Immunilogical: No cervical lymphadenopathy. Cardiovascular: Normal rate, regular rhythm.  No murmurs, rubs, or gallops.  Respiratory: Normal respiratory effort without tachypnea nor retractions. Breath sounds are clear and equal bilaterally. No wheezes/rales/rhonchi. Gastrointestinal: Soft and minimally tender in the suprapubic region. No rebound. No guarding.  Genitourinary: Deferred Musculoskeletal: Normal range of motion in all extremities. No lower extremity edema. Neurologic:  Normal speech and language. No gross focal neurologic deficits are appreciated.  Skin:  Skin is warm,  dry and intact. No rash noted. Psychiatric: Mood and affect are normal. Speech and behavior are normal. Patient exhibits appropriate insight and judgment.  ____________________________________________    LABS (pertinent positives/negatives)  CBC wbc 18.1, hgb 14.2, plt 271 BMP na 139, k 3.2, glu 144, cr 0.60 Upreg neg ____________________________________________   EKG  None  ____________________________________________    RADIOLOGY  None   ____________________________________________   PROCEDURES  Procedures  ____________________________________________   INITIAL IMPRESSION / ASSESSMENT AND PLAN / ED COURSE  Pertinent labs & imaging results that were available during my care of the patient were reviewed by me and considered in my medical decision making (see chart for details).   Patient presented to the emergency department today because of concerns for suprapubic pain.  Patient had similar pain in the past when she has had issues with ovarian cyst.  On exam patient did have some mild suprapubic tenderness.  Patient had an elevated white count however does historically get elevated white counts per chart review.  I did offer ultrasound to the patient to evaluate for ovarian pathology.  Patient however declined at this time given that her pain had resolved.  Discussed importance of follow-up with OB/GYN and potentially restarting birth control.  Did discuss return precautions.   ____________________________________________   FINAL CLINICAL IMPRESSION(S) / ED DIAGNOSES  Final diagnoses:  Pelvic pain in female     Note: This dictation was prepared with Dragon dictation. Any transcriptional errors that result from this process are unintentional     Phineas SemenGoodman, Makenzie Vittorio, MD 09/07/18 1902

## 2018-09-07 NOTE — ED Triage Notes (Signed)
Pt presents to ED [facility] from home with c/o pelvic pain, per EMS hx of ovarian cyst and ran out of birth control that helped with cysts approx 1 month ago. Pt c/o pain to lower abdomen/pelvic area. Pt texting during triage at this time. Pt states currently on menstrual cycle.

## 2018-09-07 NOTE — Discharge Instructions (Addendum)
Please seek medical attention for any high fevers, chest pain, shortness of breath, change in behavior, persistent vomiting, bloody stool or any other new or concerning symptoms.  

## 2018-09-22 ENCOUNTER — Emergency Department
Admission: EM | Admit: 2018-09-22 | Discharge: 2018-09-22 | Disposition: A | Discharge status: Home / Self Care | Payer: Medicaid Other | Attending: Emergency Medicine | Admitting: Emergency Medicine

## 2018-09-22 ENCOUNTER — Encounter: Payer: Self-pay | Admitting: *Deleted

## 2018-09-22 ENCOUNTER — Other Ambulatory Visit: Payer: Self-pay

## 2018-09-22 DIAGNOSIS — F10929 Alcohol use, unspecified with intoxication, unspecified: Secondary | ICD-10-CM

## 2018-09-22 DIAGNOSIS — F1994 Other psychoactive substance use, unspecified with psychoactive substance-induced mood disorder: Secondary | ICD-10-CM

## 2018-09-22 DIAGNOSIS — J45909 Unspecified asthma, uncomplicated: Secondary | ICD-10-CM | POA: Insufficient documentation

## 2018-09-22 DIAGNOSIS — Z7722 Contact with and (suspected) exposure to environmental tobacco smoke (acute) (chronic): Secondary | ICD-10-CM | POA: Insufficient documentation

## 2018-09-22 DIAGNOSIS — F32A Depression, unspecified: Secondary | ICD-10-CM

## 2018-09-22 DIAGNOSIS — F321 Major depressive disorder, single episode, moderate: Secondary | ICD-10-CM

## 2018-09-22 DIAGNOSIS — F1010 Alcohol abuse, uncomplicated: Secondary | ICD-10-CM

## 2018-09-22 DIAGNOSIS — F329 Major depressive disorder, single episode, unspecified: Secondary | ICD-10-CM | POA: Insufficient documentation

## 2018-09-22 DIAGNOSIS — Z79899 Other long term (current) drug therapy: Secondary | ICD-10-CM | POA: Insufficient documentation

## 2018-09-22 LAB — URINE DRUG SCREEN, QUALITATIVE (ARMC ONLY)
AMPHETAMINES, UR SCREEN: NOT DETECTED
BENZODIAZEPINE, UR SCRN: NOT DETECTED
Barbiturates, Ur Screen: NOT DETECTED
Cannabinoid 50 Ng, Ur ~~LOC~~: POSITIVE — AB
Cocaine Metabolite,Ur ~~LOC~~: NOT DETECTED
MDMA (Ecstasy)Ur Screen: NOT DETECTED
METHADONE SCREEN, URINE: NOT DETECTED
Opiate, Ur Screen: NOT DETECTED
PHENCYCLIDINE (PCP) UR S: NOT DETECTED
Tricyclic, Ur Screen: NOT DETECTED

## 2018-09-22 LAB — COMPREHENSIVE METABOLIC PANEL
ALT: 17 U/L (ref 0–44)
ANION GAP: 11 (ref 5–15)
AST: 19 U/L (ref 15–41)
Albumin: 4.4 g/dL (ref 3.5–5.0)
Alkaline Phosphatase: 128 U/L — ABNORMAL HIGH (ref 38–126)
BUN: 12 mg/dL (ref 6–20)
CALCIUM: 8.6 mg/dL — AB (ref 8.9–10.3)
CHLORIDE: 109 mmol/L (ref 98–111)
CO2: 21 mmol/L — AB (ref 22–32)
Creatinine, Ser: 0.58 mg/dL (ref 0.44–1.00)
GFR calc non Af Amer: >60 mL/min (ref >60)
Glucose, Bld: 126 mg/dL — ABNORMAL HIGH (ref 70–99)
Potassium: 3.1 mmol/L — ABNORMAL LOW (ref 3.5–5.1)
SODIUM: 141 mmol/L (ref 135–145)
Total Bilirubin: 0.5 mg/dL (ref 0.3–1.2)
Total Protein: 7.6 g/dL (ref 6.5–8.1)

## 2018-09-22 LAB — CBC WITH DIFFERENTIAL/PLATELET
ABS IMMATURE GRANULOCYTES: 0.11 10*3/uL — AB (ref 0.00–0.07)
BASOS PCT: 1 %
Basophils Absolute: 0.1 10*3/uL (ref 0.0–0.1)
EOS ABS: 0.1 10*3/uL (ref 0.0–0.5)
Eosinophils Relative: 1 %
HCT: 41.1 % (ref 36.0–46.0)
Hemoglobin: 13.6 g/dL (ref 12.0–15.0)
Immature Granulocytes: 1 %
Lymphocytes Relative: 21 %
Lymphs Abs: 3.5 10*3/uL (ref 0.7–4.0)
MCH: 29.1 pg (ref 26.0–34.0)
MCHC: 33.1 g/dL (ref 30.0–36.0)
MCV: 88 fL (ref 80.0–100.0)
MONO ABS: 0.9 10*3/uL (ref 0.1–1.0)
Monocytes Relative: 5 %
NEUTROS ABS: 11.8 10*3/uL — AB (ref 1.7–7.7)
Neutrophils Relative %: 71 %
Platelets: 371 10*3/uL (ref 150–400)
RBC: 4.67 MIL/uL (ref 3.87–5.11)
RDW: 12.3 % (ref 11.5–15.5)
WBC: 16.4 10*3/uL — ABNORMAL HIGH (ref 4.0–10.5)
nRBC: 0 % (ref 0.0–0.2)

## 2018-09-22 LAB — ETHANOL: ALCOHOL ETHYL (B): 175 mg/dL — AB (ref <10)

## 2018-09-22 LAB — ACETAMINOPHEN LEVEL

## 2018-09-22 LAB — SALICYLATE LEVEL: Salicylate Lvl: <7 mg/dL (ref 2.8–30.0)

## 2018-09-22 MED ORDER — POTASSIUM CHLORIDE CRYS ER 20 MEQ PO TBCR
40.0000 meq | EXTENDED_RELEASE_TABLET | Freq: Once | ORAL | Status: AC
  Administered 2018-09-22: 40 meq via ORAL
  Filled 2018-09-22: qty 2

## 2018-09-22 MED ORDER — PROMETHAZINE HCL 25 MG/ML IJ SOLN
12.5000 mg | Freq: Once | INTRAMUSCULAR | Status: AC
  Administered 2018-09-22: 12.5 mg via INTRAVENOUS
  Filled 2018-09-22: qty 1

## 2018-09-22 NOTE — ED Notes (Signed)
Dr. Clapacs at bedside.  Maintained on 15 minute checks and observation by security camera for safety. 

## 2018-09-22 NOTE — ED Notes (Signed)
Pt discharged to lobby. Pt's mother to give her a ride home. VS stable. Pt denies SI. Discharge paperwork reviewed with patient. Patient signed hard copy of discharge paperwork. All belongings returned to patient.

## 2018-09-22 NOTE — ED Provider Notes (Signed)
[facility] Emergency Department Provider Note   First MD Initiated Contact with Patient 09/22/18 [phone]     (approximate)  I have reviewed the triage vital signs and the nursing notes.   HISTORY  Chief Complaint Alcohol Intoxication    HPI Destiny [name] is a 18 y.o. female with below list of chronic medical conditions presents to the emergency department via EMS stating that she drank a lot of alcohol tonight because "I just did not want to be here anymore".  Patient denies suicidal ideation however does admit to feelings of depression since her grandmother died in 07-Feb-2023.   Past Medical History:  Diagnosis Date  . Asthma   . Insomnia   . Ovarian cyst     There are no active problems to display for this patient.   Past Surgical History:  Procedure Laterality Date  . TONSILLECTOMY      Prior to Admission medications   Medication Sig Start Date End Date Taking? Authorizing Provider  etonogestrel (NEXPLANON) 68 MG IMPL implant 1 each by Subdermal route once.    [provider]  hydrOXYzine (ATARAX/VISTARIL) 25 MG tablet Take 1 tablet (25 mg total) by mouth 3 (three) times daily as needed for anxiety. 06/19/18   Minna Antis, MD  LORazepam (ATIVAN) 1 MG tablet Take 1 tablet (1 mg total) by mouth 2 (two) times daily. 03/22/18 03/22/19  Emily Filbert, MD  meloxicam (MOBIC) 15 MG tablet Take 1 tablet (15 mg total) by mouth daily. Patient not taking: Reported on 03/22/2018 08/29/17   Cuthriell, Delorise Royals, PA-C  methocarbamol (ROBAXIN) 500 MG tablet Take 1 tablet (500 mg total) by mouth 4 (four) times daily. Patient not taking: Reported on 03/22/2018 08/29/17   Cuthriell, Delorise Royals, PA-C    Allergies Lorazepam; Zofran Frazier Richards hcl]; and Zolpidem  No family history on file.  Social History Social History   Tobacco Use  . Smoking status: Passive Smoke Exposure - Never Smoker  . Smokeless tobacco: Never Used  Substance Use Topics    . Alcohol use: No  . Drug use: No    Review of Systems Constitutional: No fever/chills Eyes: No visual changes. ENT: No sore throat. Cardiovascular: Denies chest pain. Respiratory: Denies shortness of breath. Gastrointestinal: No abdominal pain.  No nausea, no vomiting.  No diarrhea.  No constipation. Genitourinary: Negative for dysuria. Musculoskeletal: Negative for neck pain.  Negative for back pain. Integumentary: Negative for rash. Neurological: Negative for headaches, focal weakness or numbness. Psychiatric:Positive for heavy EtOH ingestion   ____________________________________________   PHYSICAL EXAM:  VITAL SIGNS: ED Triage Vitals  Enc Vitals Group     BP 09/22/18 0040 131/82     Pulse Rate 09/22/18 0040 (!) 101     Resp 09/22/18 0040 12     Temp 09/22/18 0044 (!) 97.2 F (36.2 C)     Temp src --      SpO2 09/22/18 0040 100 %     Weight 09/22/18 0044 69.4 kg (153 lb)     Height 09/22/18 0044 1.651 m (5\' 5" )     Head Circumference --      Peak Flow --      Pain Score --      Pain Loc --      Pain Edu? --      Excl. in GC? --     Constitutional: Alert and oriented.  Appears intoxicated, tearful eyes: Conjunctivae are normal. PERRL. EOMI. Mouth/Throat: Mucous membranes are moist.  Oropharynx non-erythematous.  Neck: No stridor.   Cardiovascular: Normal rate, regular rhythm. Good peripheral circulation. Grossly normal heart sounds. Respiratory: Normal respiratory effort.  No retractions. Lungs CTAB. Gastrointestinal: Soft and nontender. No distention.  Musculoskeletal: No lower extremity tenderness nor edema. No gross deformities of extremities. Neurologic:  Normal speech and language. No gross focal neurologic deficits are appreciated.  Skin:  Skin is warm, dry and intact. No rash noted. Psychiatric: Depressed mood.  Appears intoxicated  ____________________________________________   LABS (all labs ordered are listed, but only abnormal results are  displayed)  Labs Reviewed  COMPREHENSIVE METABOLIC PANEL - Abnormal; Notable for the following components:      Result Value   Potassium 3.1 (*)    CO2 21 (*)    Glucose, Bld 126 (*)    Calcium 8.6 (*)    Alkaline Phosphatase 128 (*)    All other components within normal limits  ACETAMINOPHEN LEVEL - Abnormal; Notable for the following components:   Acetaminophen (Tylenol), Serum <10 (*)    All other components within normal limits  ETHANOL - Abnormal; Notable for the following components:   Alcohol, Ethyl (B) 175 (*)    All other components within normal limits  CBC WITH DIFFERENTIAL/PLATELET - Abnormal; Notable for the following components:   WBC 16.4 (*)    Neutro Abs 11.8 (*)    Abs Immature Granulocytes 0.11 (*)    All other components within normal limits  SALICYLATE LEVEL  URINE DRUG SCREEN, QUALITATIVE ([facility] ONLY)  CBG MONITORING, ED  POC URINE PREG, ED    Procedures   ____________________________________________   INITIAL IMPRESSION / ASSESSMENT AND PLAN / ED COURSE  As part of my medical decision making, I reviewed the following data within the electronic MEDICAL RECORD NUMBER   18 year old female presenting with above-stated history and physical exam secondary to heavy alcohol ingestion with depression and suicidal thoughts.  Patient admits to depressed mood and acknowledges the fact that she needs psychiatric help.  Patient involuntarily committed awaiting psychiatry evaluation at this time. ____________________________________________  FINAL CLINICAL IMPRESSION(S) / ED DIAGNOSES  Final diagnoses:  Alcoholic intoxication with complication (HCC)  Depression, unspecified depression type     MEDICATIONS GIVEN DURING THIS VISIT:  Medications  promethazine (PHENERGAN) injection 12.5 mg (12.5 mg Intravenous Given 09/22/18 0100)     ED Discharge Orders    None       Note:  This document was prepared using Dragon voice recognition software and may include  unintentional dictation errors.    Darci Current, MD 09/22/18 [phone]

## 2018-09-22 NOTE — ED Provider Notes (Signed)
-----------------------------------------   12:50 PM on 09/22/2018 -----------------------------------------  Patient does contract for safety, we have counseled her about drug and alcohol abuse, she has no SI or HI, return precautions follow-up given understood.  He was seen and evaluated by psychiatry, they see no active issue that would mandate inpatient care and we will refer to outpatient drug and alcohol therapy   Jeanmarie Plant, MD 09/22/18 1250

## 2018-09-22 NOTE — BH Assessment (Signed)
Assessment Note  Destiny [name] is an 18 y.o. female Who presents to the ER due to alcohol intoxication and reporting SI. Upon arrival to the ER her BAC 175 and UDS was positive for cannabis.  Per the report of the patient, she have no memory of the events that took place that brought her to the ER. She states she drank liquor for the first time last night (09/21/2018). She lives with her girlfriend and roommates and currently in college online. Per her report, she have no concerns or conflicts with anyone she lives with.   Patient admits to dealing with depression since the death of her grandmother. She further explained, she was receiving outpatient treatment in the past for depression but the recent death caused her to regress. Patient denies having thoughts of wanting to end her life. She also denies past attempts and gestures.  During the interview, the patient was calm, cooperative and pleasant. She was able to provide appropriate answers to the questions. Throughout the interview, the patient denied SI/HI and AV/H.    Diagnosis: Depression  Past Medical History:  Past Medical History:  Diagnosis Date  . Asthma   . Insomnia   . Ovarian cyst     Past Surgical History:  Procedure Laterality Date  . TONSILLECTOMY      Family History: No family history on file.  Social History:  reports that she is a non-smoker but has been exposed to tobacco smoke. She has never used smokeless tobacco. She reports that she does not drink alcohol or use drugs.  Additional Social History:  Alcohol / Drug Use Pain Medications: See PTA Prescriptions: See PTA Over the Counter: See PTA History of alcohol / drug use?: Yes Longest period of sobriety (when/how [name]): Unable to quantify Negative Consequences of Use: (n/a) Withdrawal Symptoms: (n/a) Substance #1 Name of Substance 1: Alcohol 1 - Age of First Use: 17 1 - Amount (size/oz): Unable to quantify 1 - Frequency: One time use 1 - Duration: One  time use 1 - Last Use / Amount: 09/21/2018 Substance #2 Name of Substance 2: Cannabis 2 - Age of First Use: 16 2 - Amount (size/oz): Unable to quantify 2 - Frequency: Two times a week 2 - Duration: Since the age of68 2 - Last Use / Amount: 09/20/2018  CIWA: CIWA-Ar BP: 112/62 Pulse Rate: 82 COWS:    Allergies:  Allergies  Allergen Reactions  . Lorazepam Other (See Comments)    Feels funny  . Zofran [Ondansetron Hcl] Hives and Itching  . Zolpidem Other (See Comments)    Feels funny    Home Medications: (Not in a hospital admission)   OB/GYN Status:  Patient's last menstrual period was 09/07/2018 (exact date).  General Assessment Data Location of Assessment: [facility] ED TTS Assessment: In system Is this a Tele or Face-to-Face Assessment?: Face-to-Face Is this an Initial Assessment or a Re-assessment for this encounter?: Initial Assessment Patient Accompanied by:: N/A Language Other than English: No Living Arrangements: Other (Comment)(Private Home) What gender do you identify as?: Female Marital status: Single Pregnancy Status: No Living Arrangements: Non-relatives/Friends Can pt return to current living arrangement?: Yes Admission Status: Involuntary Petitioner: ED Attending Is patient capable of signing voluntary admission?: No(Under IVC) Referral Source: Self/Family/Friend Insurance type: None  Medical Screening Exam [facility] Walk-in ONLY) Medical Exam completed: Yes  Crisis Care Plan Living Arrangements: Non-relatives/Friends Legal Guardian: Other:(Self) Name of Psychiatrist: Reports of none Name of Therapist: Reports of none  Education Status Is patient currently in school?:  Yes Current Grade: Freshman Highest grade of school patient has completed: High School Name of school: [employer] Contact person: n/a IEP information if applicable: n/a  Risk to self with the past 6 months Suicidal Ideation: No Has patient been a risk to self within the past 6 months  prior to admission? : No Suicidal Intent: No Has patient had any suicidal intent within the past 6 months prior to admission? : No Is patient at risk for suicide?: No Suicidal Plan?: No Has patient had any suicidal plan within the past 6 months prior to admission? : No Access to Means: No What has been your use of drugs/alcohol within the last 12 months?: Alcohol & Cannabis Previous Attempts/Gestures: No How many times?: 0 Other Self Harm Risks: Reports of none Triggers for Past Attempts: None known Intentional Self Injurious Behavior: None Family Suicide History: No Recent stressful life event(s): Loss (Comment), Other (Comment) Persecutory voices/beliefs?: No Depression: Yes Depression Symptoms: Loss of interest in usual pleasures, Feeling worthless/self pity Substance abuse history and/or treatment for substance abuse?: Yes Suicide prevention information given to non-admitted patients: Not applicable  Risk to Others within the past 6 months Homicidal Ideation: No Does patient have any lifetime risk of violence toward others beyond the six months prior to admission? : No Thoughts of Harm to Others: No Current Homicidal Intent: No Current Homicidal Plan: No Access to Homicidal Means: No Identified Victim: Reports of none History of harm to others?: No Assessment of Violence: None Noted Violent Behavior Description: Reports of none Does patient have access to weapons?: No Criminal Charges Pending?: No Does patient have a court date: No Is patient on probation?: No  Psychosis Hallucinations: None noted Delusions: None noted  Mental Status Report Appearance/Hygiene: Unremarkable, In scrubs Eye Contact: Fair Motor Activity: Freedom of movement, Unremarkable Speech: Logical/coherent, Unremarkable Level of Consciousness: Alert Mood: Sad, Depressed Affect: Appropriate to circumstance, Depressed, Sad Anxiety Level: Minimal Thought Processes: Coherent, Relevant Judgement:  Unimpaired Orientation: Person, Place, Time, Situation, Appropriate for developmental age Obsessive Compulsive Thoughts/Behaviors: Minimal  Cognitive Functioning Concentration: Normal Memory: Recent Intact, Remote Intact Is patient IDD: No Insight: Fair Impulse Control: Fair Appetite: Fair Have you had any weight changes? : No Change Sleep: No Change Total Hours of Sleep: 8 Vegetative Symptoms: None  ADLScreening [place]([facility] Assessment Services) Patient's cognitive ability adequate to safely complete daily activities?: Yes Patient able to express need for assistance with ADLs?: Yes Independently performs ADLs?: Yes (appropriate for developmental age)  Prior Inpatient Therapy Prior Inpatient Therapy: No  Prior Outpatient Therapy Prior Outpatient Therapy: Yes Prior Therapy Dates: 2019 Prior [facility]Provider[facility] Reason for Treatment: Depression Does patient have an ACCT team?: No Does patient have Intensive In-House Services?  : No Does patient have [facility] services? : No Does patient have P4CC services?: No  ADL Screening (condition at time of admission) Patient's cognitive ability adequate to safely complete daily activities?: Yes Is the patient deaf or have difficulty hearing?: No Does the patient have difficulty seeing, even when wearing glasses/contacts?: No Does the patient have difficulty concentrating, remembering, or making decisions?: No Patient able to express need for assistance with ADLs?: Yes Does the patient have difficulty dressing or bathing?: No Independently performs ADLs?: Yes (appropriate for developmental age) Does the patient have difficulty walking or climbing stairs?: No Weakness of Legs: None Weakness of Arms/Hands: None  Home Assistive Devices/Equipment Home Assistive Devices/Equipment: None  Therapy Consults (therapy consults require a physician order) PT Evaluation Needed: No OT Evalulation Needed: No  SLP Evaluation Needed:  No Abuse/Neglect Assessment (Assessment to be complete while patient is alone) Abuse/Neglect Assessment Can Be Completed: Yes Physical Abuse: Denies Verbal Abuse: Denies Sexual Abuse: Denies Exploitation of patient/patient's resources: Denies Self-Neglect: Denies Values / Beliefs Cultural Requests During Hospitalization: None Spiritual Requests During Hospitalization: None Consults Spiritual Care Consult Needed: No Social Work Consult Needed: No Merchant navy officerAdvance Directives (For Healthcare) Does Patient Have a Medical Advance Directive?: No       Child/Adolescent Assessment Running Away Risk: Denies(Patient is an adult)  Disposition:  Disposition Initial Assessment Completed for this Encounter: Yes  On Site Evaluation by:   Reviewed with Physician:     Lilyan Gilfordalvin J. Rekia Kujala MS, LCAS, LPC, NCC, CCSI Therapeutic Triage Specialist 09/22/2018 12:37 PM

## 2018-09-22 NOTE — ED Notes (Signed)
Assisted up to BR, UA collected. Pt changed into paper scrubs. Clothes placed in bag (black pants, underwear, bra, shirt and black sneakers) Pt has her blue blanket on bed. Pt understands she must stay here until she talks with psychiatrist and then a plan will be made. Pt mother was here but had to go home but agrees with POC for evaluation. Pt returned to stretcher and is sound asleep in hallbed 1 with direct observation by this Clinical research associate

## 2018-09-22 NOTE — Consult Note (Signed)
[facility]   Reason for Consult: Consult for this patient who came into the emergency room last night intoxicated with allegations of suicidal ideation Referring Physician: McShane Patient Identification: Destiny BudDestiny [name] MRN:  [id number] Principal Diagnosis: Moderate major depression, single episode (HCC) Diagnosis:  Principal Problem:   Moderate major depression, single episode (HCC) Active Problems:   Alcohol abuse   Substance induced mood disorder (HCC)   Total Time spent with patient: 1 hour  Subjective:   Destiny [name] is a 18 y.o. female patient admitted with "I just want to go home".  HPI: Patient seen chart reviewed.  18 year old came to the emergency room last night after consuming much of a bottle of vodka and calling her parents very upset because she had had so much to drink she could not even stand up.  Patient says she does not normally drink and certainly does not drink every day and that the amount she had last night was an unusual amount.  She says that she was trying to get some sleep.  She reported that she was considering taking Benadryl as well but did not and denies to me that she took any other pills.  Patient reports that her mood has been down and depressed since her grandmother died last April.  Sounds like it has been getting worse the last couple months.  Feels negative much of the time.  Does not sleep well.  Does not feel like she is taking care of herself very well.  No psychotic symptoms.  Patient denies any suicide attempts and denies suicidal ideation.  She is not currently taking any medication for depression.  Not seeing a counselor.  Medical history: No significant ongoing medical problems.  Mild acne.  Social history: Patient lives with her girlfriend and some other young people.  She says that she does have a job and has been going when she can although she has missed quite a bit of work because of her mood recently.  Indicates that  she feels she has a good relationship with her parents.  Substance abuse history: Patient denies any regular alcohol abuse.  Says the amount she consumed last night quite unusual.  Blood alcohol was 175 on presentation.  Her being as intoxicated as she was with this blood alcohol suggest to me that she probably is not a heavy regular drinker.  She also has marijuana in her drug screen admitted and admits to occasional use of marijuana.  History of substance abuse treatment  Past Psychiatric History: Patient says she has never seen a counselor or therapist or mental health provider.  Never been on any prescription medicine for depression.  No prior history of psychiatric evaluation or treatment  Risk to Self: Suicidal Ideation: No Suicidal Intent: No Is patient at risk for suicide?: No Suicidal Plan?: No Access to Means: No What has been your use of drugs/alcohol within the last 12 months?: Alcohol & Cannabis How many times?: 0 Other Self Harm Risks: Reports of none Triggers for Past Attempts: None known Intentional Self Injurious Behavior: None Risk to Others: Homicidal Ideation: No Thoughts of Harm to Others: No Current Homicidal Intent: No Current Homicidal Plan: No Access to Homicidal Means: No Identified Victim: Reports of none History of harm to others?: No Assessment of Violence: None Noted Violent Behavior Description: Reports of none Does patient have access to weapons?: No Criminal Charges Pending?: No Does patient have a court date: No Prior Inpatient Therapy: Prior Inpatient Therapy: No Prior Outpatient  Therapy: Prior Outpatient Therapy: Yes Prior Therapy Dates: 2019 Prior Therapy Facilty/Provider(s): [facility] Reason for Treatment: Depression Does patient have an ACCT team?: No Does patient have Intensive In-House Services?  : No Does patient have [facility] services? : No Does patient have P4CC services?: No  Past Medical History:  Past Medical History:  Diagnosis  Date  . Asthma   . Insomnia   . Ovarian cyst     Past Surgical History:  Procedure Laterality Date  . TONSILLECTOMY     Family History: No family history on file. Family Psychiatric  History: None known Social History:  Social History   Substance and Sexual Activity  Alcohol Use No     Social History   Substance and Sexual Activity  Drug Use No    Social History   Socioeconomic History  . Marital status: Single    Spouse name: Not on file  . Number of children: Not on file  . Years of education: Not on file  . Highest education level: Not on file  Occupational History  . Not on file  Social Needs  . Financial resource strain: Not on file  . Food insecurity:    Worry: Not on file    Inability: Not on file  . Transportation needs:    Medical: Not on file    Non-medical: Not on file  Tobacco Use  . Smoking status: Passive Smoke Exposure - Never Smoker  . Smokeless tobacco: Never Used  Substance and Sexual Activity  . Alcohol use: No  . Drug use: No  . Sexual activity: Not on file  Lifestyle  . Physical activity:    Days per week: Not on file    Minutes per session: Not on file  . Stress: Not on file  Relationships  . Social connections:    Talks on phone: Not on file    Gets together: Not on file    Attends religious service: Not on file    Active member of club or organization: Not on file    Attends meetings of clubs or organizations: Not on file    Relationship status: Not on file  Other Topics Concern  . Not on file  Social History Narrative  . Not on file   Additional Social History:    Allergies:   Allergies  Allergen Reactions  . Lorazepam Other (See Comments)    Feels funny  . Zofran [Ondansetron Hcl] Hives and Itching  . Zolpidem Other (See Comments)    Feels funny    Labs:  Results for orders placed or performed during the hospital encounter of 09/22/18 (from the past 48 hour(s))  Comprehensive metabolic panel     Status: Abnormal    Collection Time: 09/22/18  1:01 AM  Result Value Ref Range   Sodium 141 135 - 145 mmol/L   Potassium 3.1 (L) 3.5 - 5.1 mmol/L   Chloride 109 98 - 111 mmol/L   CO2 21 (L) 22 - 32 mmol/L   Glucose, Bld 126 (H) 70 - 99 mg/dL   BUN 12 6 - 20 mg/dL   Creatinine, Ser 1.74 0.44 - 1.00 mg/dL   Calcium 8.6 (L) 8.9 - 10.3 mg/dL   Total Protein 7.6 6.5 - 8.1 g/dL   Albumin 4.4 3.5 - 5.0 g/dL   AST 19 15 - 41 U/L   ALT 17 0 - 44 U/L   Alkaline Phosphatase 128 (H) 38 - 126 U/L   Total Bilirubin 0.5 0.3 - 1.2 mg/dL  GFR calc non Af Amer >60 >60 mL/min   GFR calc Af Amer >60 >60 mL/min   Anion gap 11 5 - 15    Comment: Performed at [facility], [address] [place], [place] [address]  Salicylate level     Status: None   Collection Time: 09/22/18  1:01 AM  Result Value Ref Range   Salicylate Lvl <7.0 2.8 - 30.0 mg/dL    Comment: Performed at [facility], [address] [place], [place] [address]  Acetaminophen level     Status: Abnormal   Collection Time: 09/22/18  1:01 AM  Result Value Ref Range   Acetaminophen (Tylenol), Serum <10 (L) 10 - 30 ug/mL    Comment: (NOTE) Therapeutic concentrations vary significantly. A range of 10-30 ug/mL  may be an effective concentration for many patients. However, some  are best treated at concentrations outside of this range. Acetaminophen concentrations >150 ug/mL at 4 hours after ingestion  and >50 ug/mL at 12 hours after ingestion are often associated with  toxic reactions. Performed at [facility], [address] [place], [place] [address]   Ethanol     Status: Abnormal   Collection Time: 09/22/18  1:01 AM  Result Value Ref Range   Alcohol, Ethyl (B) 175 (H) <10 mg/dL    Comment: (NOTE) Lowest detectable limit for serum alcohol is 10 mg/dL. For medical purposes only. Performed at [facility], [address] [place], [place] [address]   CBC WITH DIFFERENTIAL     Status: Abnormal    Collection Time: 09/22/18  1:01 AM  Result Value Ref Range   WBC 16.4 (H) 4.0 - 10.5 K/uL   RBC 4.67 3.87 - 5.11 MIL/uL   Hemoglobin 13.6 12.0 - 15.0 g/dL   HCT 29.5 62.1 - 30.8 %   MCV 88.0 80.0 - 100.0 fL   MCH 29.1 26.0 - 34.0 pg   MCHC 33.1 30.0 - 36.0 g/dL   RDW 65.7 84.6 - 96.2 %   Platelets 371 150 - 400 K/uL   nRBC 0.0 0.0 - 0.2 %   Neutrophils Relative % 71 %   Neutro Abs 11.8 (H) 1.7 - 7.7 K/uL   Lymphocytes Relative 21 %   Lymphs Abs 3.5 0.7 - 4.0 K/uL   Monocytes Relative 5 %   Monocytes Absolute 0.9 0.1 - 1.0 K/uL   Eosinophils Relative 1 %   Eosinophils Absolute 0.1 0.0 - 0.5 K/uL   Basophils Relative 1 %   Basophils Absolute 0.1 0.0 - 0.1 K/uL   Immature Granulocytes 1 %   Abs Immature Granulocytes 0.11 (H) 0.00 - 0.07 K/uL    Comment: Performed at [facility], [address] [place], [place] [address]  Urine Drug Screen, Qualitative     Status: Abnormal   Collection Time: 09/22/18  3:50 AM  Result Value Ref Range   Tricyclic, Ur Screen NONE DETECTED NONE DETECTED   Amphetamines, Ur Screen NONE DETECTED NONE DETECTED   MDMA (Ecstasy)Ur Screen NONE DETECTED NONE DETECTED   Cocaine Metabolite,Ur [place] NONE DETECTED NONE DETECTED   Opiate, Ur Screen NONE DETECTED NONE DETECTED   Phencyclidine (PCP) Ur S NONE DETECTED NONE DETECTED   Cannabinoid 50 Ng, Ur [place] POSITIVE (A) NONE DETECTED   Barbiturates, Ur Screen NONE DETECTED NONE DETECTED   Benzodiazepine, Ur Scrn NONE DETECTED NONE DETECTED   Methadone Scn, Ur NONE DETECTED NONE DETECTED    Comment: (NOTE) Tricyclics + metabolites, urine    Cutoff  1000 ng/mL Amphetamines + metabolites, urine  Cutoff 1000 ng/mL MDMA (Ecstasy), urine              Cutoff 500 ng/mL Cocaine Metabolite, urine          Cutoff 300 ng/mL Opiate + metabolites, urine        Cutoff 300 ng/mL Phencyclidine (PCP), urine         Cutoff 25 ng/mL Cannabinoid, urine                 Cutoff 50 ng/mL Barbiturates + metabolites, urine   Cutoff 200 ng/mL Benzodiazepine, urine              Cutoff 200 ng/mL Methadone, urine                   Cutoff 300 ng/mL The urine drug screen provides only a preliminary, unconfirmed analytical test result and should not be used for non-medical purposes. Clinical consideration and professional judgment should be applied to any positive drug screen result due to possible interfering substances. A more specific alternate chemical method must be used in order to obtain a confirmed analytical result. Gas chromatography / mass spectrometry (GC/MS) is the preferred confirmat ory method. Performed at [facility], [address] [place], [place] [address]     Current Facility-Administered Medications  Medication Dose Route Frequency Provider Last Rate Last Dose  . potassium chloride SA (K-DUR,KLOR-CON) CR tablet 40 mEq  40 mEq Oral Once Jeanmarie PlantMcShane, James A, MD       Current Outpatient Medications  Medication Sig Dispense Refill  . etonogestrel (NEXPLANON) 68 MG IMPL implant 1 each by Subdermal route once.    . hydrOXYzine (ATARAX/VISTARIL) 25 MG tablet Take 1 tablet (25 mg total) by mouth 3 (three) times daily as needed for anxiety. 20 tablet 0  . LORazepam (ATIVAN) 1 MG tablet Take 1 tablet (1 mg total) by mouth 2 (two) times daily. 20 tablet 0  . meloxicam (MOBIC) 15 MG tablet Take 1 tablet (15 mg total) by mouth daily. (Patient not taking: Reported on 03/22/2018) 30 tablet 0  . methocarbamol (ROBAXIN) 500 MG tablet Take 1 tablet (500 mg total) by mouth 4 (four) times daily. (Patient not taking: Reported on 03/22/2018) 16 tablet 0    Musculoskeletal: Strength & Muscle Tone: within normal limits Gait & Station: normal Patient leans: N/A  Psychiatric Specialty Exam: Physical Exam  Nursing note and vitals reviewed. Constitutional: She appears well-developed and well-nourished.  HENT:  Head: Normocephalic and atraumatic.  Eyes: Pupils are equal, round, and reactive to light.  Conjunctivae are normal.  Neck: Normal range of motion.  Cardiovascular: Regular rhythm and normal heart sounds.  Respiratory: Effort normal. No respiratory distress.  GI: Soft.  Musculoskeletal: Normal range of motion.  Neurological: She is alert.  Skin: Skin is warm and dry.  Psychiatric: Her speech is normal and behavior is normal. Judgment and thought content normal. Cognition and memory are normal. She exhibits a depressed mood.    Review of Systems  Constitutional: Negative.   HENT: Negative.   Eyes: Negative.   Respiratory: Negative.   Cardiovascular: Negative.   Gastrointestinal: Negative.   Musculoskeletal: Negative.   Skin: Negative.   Neurological: Negative.   Psychiatric/Behavioral: Positive for depression, memory loss and substance abuse. Negative for hallucinations and suicidal ideas. The patient is nervous/anxious and has insomnia.     Blood pressure 112/62, pulse 82, temperature 98.2 F (36.8 C), temperature source Oral, resp. rate 12, height  5\' 5"  (1.651 m), weight 69.4 kg, last menstrual period 09/07/2018, SpO2 100 %.Body mass index is 25.46 kg/m.  General Appearance: Disheveled  Eye Contact:  Good  Speech:  Clear and Coherent  Volume:  Normal  Mood:  Dysphoric  Affect:  Congruent  Thought Process:  Goal Directed  Orientation:  Full (Time, Place, and Person)  Thought Content:  Logical  Suicidal Thoughts:  No  Homicidal Thoughts:  No  Memory:  Immediate;   Fair Recent;   Fair Remote;   Fair  Judgement:  Fair  Insight:  Fair  Psychomotor Activity:  Decreased  Concentration:  Concentration: Fair  Recall:  [employer] of Knowledge:  Fair  Language:  Fair  Akathisia:  No  Handed:  Right  AIMS (if indicated):     Assets:  Desire for Improvement Housing Physical Health Resilience Social Support  ADL's:  Intact  Cognition:  WNL  Sleep:        Treatment Plan Summary: Plan 18 year old came to the emergency room intoxicated.  There were reports from  secondhand sources that she may have made suicidal statements.  Patient has consistently denied here wanting to die.  To me he denies any suicidal ideation although she does endorse multiple symptoms consistent with major depression.  Psychoeducation with the patient about the dangers of abuse of alcohol when depression is already involved.  Reviewed options for appropriate treatment.  She will be referred to [facility] for outpatient mental health treatment and evaluation.  Does not meet commitment criteria and any longer.  Discontinue IVC case reviewed with emergency room doctor  Disposition: No evidence of imminent risk to self or others at present.   Patient does not meet criteria for psychiatric inpatient admission. Supportive therapy provided about ongoing stressors. Discussed crisis plan, support from social network, calling 911, coming to the Emergency Department, and calling Suicide Hotline.  Mordecai Rasmussen, MD 09/22/2018 1:19 PM

## 2018-09-22 NOTE — ED Notes (Signed)
TTS at bedside.  Maintained on 15 minute checks and observation by security camera for safety.

## 2018-09-22 NOTE — ED Notes (Signed)
Pt dressing for discharge. Maintained on 15 minute checks and observation by security camera for safety. 

## 2018-09-22 NOTE — Discharge Instructions (Signed)
Up as directed above, return to the emergency room for any new or worrisome symptoms including thoughts of hurting yourself or someone else.  Do not drink to excess.
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
## 2018-09-22 NOTE — ED Notes (Signed)
Pt given breakfast tray

## 2018-09-26 ENCOUNTER — Other Ambulatory Visit: Payer: Self-pay

## 2018-09-26 ENCOUNTER — Emergency Department
Admission: EM | Admit: 2018-09-26 | Discharge: 2018-09-26 | Disposition: A | Discharge status: Home / Self Care | Payer: Self-pay | Attending: Emergency Medicine | Admitting: Emergency Medicine

## 2018-09-26 ENCOUNTER — Encounter: Payer: Self-pay | Admitting: Emergency Medicine

## 2018-09-26 DIAGNOSIS — J45909 Unspecified asthma, uncomplicated: Secondary | ICD-10-CM | POA: Insufficient documentation

## 2018-09-26 DIAGNOSIS — N3001 Acute cystitis with hematuria: Secondary | ICD-10-CM | POA: Insufficient documentation

## 2018-09-26 DIAGNOSIS — R3915 Urgency of urination: Secondary | ICD-10-CM | POA: Insufficient documentation

## 2018-09-26 DIAGNOSIS — R350 Frequency of micturition: Secondary | ICD-10-CM | POA: Insufficient documentation

## 2018-09-26 DIAGNOSIS — Z79899 Other long term (current) drug therapy: Secondary | ICD-10-CM | POA: Insufficient documentation

## 2018-09-26 DIAGNOSIS — Z7722 Contact with and (suspected) exposure to environmental tobacco smoke (acute) (chronic): Secondary | ICD-10-CM | POA: Insufficient documentation

## 2018-09-26 LAB — URINALYSIS, COMPLETE (UACMP) WITH MICROSCOPIC
BACTERIA UA: NONE SEEN
Bilirubin Urine: NEGATIVE
Glucose, UA: NEGATIVE mg/dL
KETONES UR: NEGATIVE mg/dL
Nitrite: NEGATIVE
Protein, ur: 30 mg/dL — AB
Specific Gravity, Urine: 1.023 (ref 1.005–1.030)
WBC, UA: >50 WBC/hpf — ABNORMAL HIGH (ref 0–5)
pH: 6 (ref 5.0–8.0)

## 2018-09-26 LAB — PREGNANCY, URINE: Preg Test, Ur: NEGATIVE

## 2018-09-26 MED ORDER — PHENAZOPYRIDINE HCL 100 MG PO TABS: 100.0000 mg | ORAL_TABLET | Freq: Three times a day (TID) | ORAL | 0 refills | Status: DC | PRN

## 2018-09-26 MED ORDER — CEPHALEXIN 500 MG PO CAPS: 500.0000 mg | ORAL_CAPSULE | Freq: Three times a day (TID) | ORAL | 0 refills | Status: DC

## 2018-09-26 NOTE — ED Provider Notes (Signed)
[facility] Emergency Department Provider Note  ____________________________________________   First MD Initiated Contact with Patient 09/26/18 [phone]     (approximate)  I have reviewed the triage vital signs and the nursing notes.   HISTORY  Chief Complaint Dysuria   HPI Destiny [name] is a 18 y.o. female presents to the ED with complaint of 1 week feeling urinary discomfort but in the last 24 hours has developed urinary frequency and urgency.  Patient denies any fever, nausea or vomiting.  She denies any vaginal discharge.  Past Medical History:  Diagnosis Date  . Asthma   . Insomnia   . Ovarian cyst     Patient Active Problem List   Diagnosis Date Noted  . Moderate major depression, single episode (HCC) 09/22/2018  . Alcohol abuse 09/22/2018  . Substance induced mood disorder (HCC) 09/22/2018    Past Surgical History:  Procedure Laterality Date  . TONSILLECTOMY      Prior to Admission medications   Medication Sig Start Date End Date Taking? Authorizing Provider  cephALEXin (KEFLEX) 500 MG capsule Take 1 capsule (500 mg total) by mouth 3 (three) times daily. 09/26/18   Tommi RumpsSummers, Rynell Ciotti L, PA-C  etonogestrel (NEXPLANON) 68 MG IMPL implant 1 each by Subdermal route once.    [provider]  hydrOXYzine (ATARAX/VISTARIL) 25 MG tablet Take 1 tablet (25 mg total) by mouth 3 (three) times daily as needed for anxiety. 06/19/18   Minna AntisPaduchowski, Kevin, MD  LORazepam (ATIVAN) 1 MG tablet Take 1 tablet (1 mg total) by mouth 2 (two) times daily. 03/22/18 03/22/19  Emily FilbertWilliams, Jonathan E, MD  phenazopyridine (PYRIDIUM) 100 MG tablet Take 1 tablet (100 mg total) by mouth 3 (three) times daily as needed for pain. 09/26/18 09/26/19  Tommi RumpsSummers, Antion Andres L, PA-C    Allergies Lorazepam; Zofran Frazier Richards[ondansetron hcl]; and Zolpidem  No family history on file.  Social History Social History   Tobacco Use  . Smoking status: Passive Smoke Exposure - Never Smoker  . Smokeless  tobacco: Never Used  Substance Use Topics  . Alcohol use: No  . Drug use: No    Review of Systems Constitutional: No fever/chills Eyes: No visual changes. ENT: No sore throat. Cardiovascular: Denies chest pain. Respiratory: Denies shortness of breath. Gastrointestinal: No abdominal pain.  No nausea, no vomiting.  No diarrhea.  Genitourinary: Positive for dysuria.  Negative for vaginal discharge. Musculoskeletal: Negative for back pain. Skin: Negative for rash. Neurological: Negative for headaches, focal weakness or numbness.   ____________________________________________   PHYSICAL EXAM:  VITAL SIGNS: ED Triage Vitals [09/26/18 0847]  Enc Vitals Group     BP 128/77     Pulse Rate 100     Resp 16     Temp (!) 97.4 F (36.3 C)     Temp Source Oral     SpO2 97 %     Weight 163 lb (73.9 kg)     Height 5\' 5"  (1.651 m)     Head Circumference      Peak Flow      Pain Score 0     Pain Loc      Pain Edu?      Excl. in GC?     Constitutional: Alert and oriented. Well appearing and in no acute distress. Eyes: Conjunctivae are normal.  Head: Atraumatic. Neck: No stridor.   Cardiovascular: Normal rate, regular rhythm. Grossly normal heart sounds.  Good peripheral circulation. Respiratory: Normal respiratory effort.  No retractions. Lungs CTAB. Gastrointestinal: Soft  and nontender. No distention.  No CVA tenderness. Musculoskeletal: No lower extremity tenderness nor edema.  No joint effusions. Neurologic:  Normal speech and language. No gross focal neurologic deficits are appreciated. No gait instability. Skin:  Skin is warm, dry and intact. No rash noted. Psychiatric: Mood and affect are normal. Speech and behavior are normal.  ____________________________________________   LABS (all labs ordered are listed, but only abnormal results are displayed)  Labs Reviewed  URINALYSIS, COMPLETE (UACMP) WITH MICROSCOPIC - Abnormal; Notable for the following components:       Result Value   Color, Urine YELLOW (*)    APPearance HAZY (*)    Hgb urine dipstick MODERATE (*)    Protein, ur 30 (*)    Leukocytes, UA SMALL (*)    WBC, UA >50 (*)    All other components within normal limits  PREGNANCY, URINE     PROCEDURES  Procedure(s) performed: None  Procedures  Critical Care performed: No  ____________________________________________   INITIAL IMPRESSION / ASSESSMENT AND PLAN / ED COURSE  As part of my medical decision making, I reviewed the following data within the electronic MEDICAL RECORD NUMBER Notes from prior ED visits and [place] Controlled Substance Database  Patient presents to the ED with complaint of dysuria with beginning of urgency and hesitancy this morning.  Patient states she needs a note for work as she is had frequency for the last several days.  She denies any fever, chills, nausea or vomiting.  She has had a urinary tract infection in the past.  Patient denies any vaginal discharge.  Urinalysis was reassuring that she does have a urinary tract infection.  Patient was placed on Keflex 500 mg 3 times daily for 10 days along with Pyridium 3 times daily for dysuria.  Patient is aware that this will cause her urine to become discolored.  She may also take Tylenol as needed.  Increase fluids and follow-up with her PCP if any continued problems.  ____________________________________________   FINAL CLINICAL IMPRESSION(S) / ED DIAGNOSES  Final diagnoses:  Acute cystitis with hematuria     ED Discharge Orders         Ordered    phenazopyridine (PYRIDIUM) 100 MG tablet  3 times daily PRN     09/26/18 1002    cephALEXin (KEFLEX) 500 MG capsule  3 times daily     09/26/18 1002           Note:  This document was prepared using Dragon voice recognition software and may include unintentional dictation errors.    Tommi Rumps, PA-C 09/26/18 1057    Minna Antis, MD 09/26/18 1453

## 2018-09-26 NOTE — ED Triage Notes (Signed)
Pt to ED via POV c/o dysuria x 1 week. Pt is in NAD at this time.

## 2018-09-26 NOTE — Discharge Instructions (Addendum)
Take medication until completely finished.  Increase fluids.  You may take Tylenol if needed for pain.  The Pyridium that we discussed will turn your urine a neon yellow.  This is only the medication.  Follow-up with your primary care provider if any continued problems.

## 2018-09-26 NOTE — ED Notes (Signed)
Urine preg test added to urine, called lab spoke with Maralyn Sago

## 2018-11-01 ENCOUNTER — Other Ambulatory Visit: Payer: Self-pay

## 2018-11-01 ENCOUNTER — Emergency Department
Admission: EM | Admit: 2018-11-01 | Discharge: 2018-11-01 | Disposition: A | Discharge status: Other Acute Inpatient Hospital | Payer: Medicaid Other | Attending: Emergency Medicine | Admitting: Emergency Medicine

## 2018-11-01 ENCOUNTER — Encounter: Payer: Self-pay | Admitting: Psychiatry

## 2018-11-01 ENCOUNTER — Inpatient Hospital Stay
Admission: AD | Admit: 2018-11-01 | Discharge: 2018-11-02 | DRG: 885 | Disposition: A | Discharge status: Home / Self Care | Payer: No Typology Code available for payment source | Source: Intra-hospital | Attending: Psychiatry | Admitting: Psychiatry

## 2018-11-01 DIAGNOSIS — Z915 Personal history of self-harm: Secondary | ICD-10-CM

## 2018-11-01 DIAGNOSIS — Z793 Long term (current) use of hormonal contraceptives: Secondary | ICD-10-CM | POA: Diagnosis not present

## 2018-11-01 DIAGNOSIS — R110 Nausea: Secondary | ICD-10-CM | POA: Insufficient documentation

## 2018-11-01 DIAGNOSIS — F332 Major depressive disorder, recurrent severe without psychotic features: Secondary | ICD-10-CM | POA: Diagnosis present

## 2018-11-01 DIAGNOSIS — N390 Urinary tract infection, site not specified: Secondary | ICD-10-CM | POA: Diagnosis present

## 2018-11-01 DIAGNOSIS — T50992A Poisoning by other drugs, medicaments and biological substances, intentional self-harm, initial encounter: Secondary | ICD-10-CM | POA: Insufficient documentation

## 2018-11-01 DIAGNOSIS — J45909 Unspecified asthma, uncomplicated: Secondary | ICD-10-CM | POA: Diagnosis present

## 2018-11-01 DIAGNOSIS — Z79899 Other long term (current) drug therapy: Secondary | ICD-10-CM

## 2018-11-01 DIAGNOSIS — Z9189 Other specified personal risk factors, not elsewhere classified: Secondary | ICD-10-CM

## 2018-11-01 DIAGNOSIS — Z818 Family history of other mental and behavioral disorders: Secondary | ICD-10-CM | POA: Diagnosis not present

## 2018-11-01 DIAGNOSIS — G4700 Insomnia, unspecified: Secondary | ICD-10-CM | POA: Diagnosis present

## 2018-11-01 DIAGNOSIS — T50901A Poisoning by unspecified drugs, medicaments and biological substances, accidental (unintentional), initial encounter: Secondary | ICD-10-CM | POA: Diagnosis present

## 2018-11-01 DIAGNOSIS — F333 Major depressive disorder, recurrent, severe with psychotic symptoms: Secondary | ICD-10-CM | POA: Insufficient documentation

## 2018-11-01 DIAGNOSIS — Z7722 Contact with and (suspected) exposure to environmental tobacco smoke (acute) (chronic): Secondary | ICD-10-CM | POA: Insufficient documentation

## 2018-11-01 DIAGNOSIS — R45851 Suicidal ideations: Secondary | ICD-10-CM

## 2018-11-01 DIAGNOSIS — T50902A Poisoning by unspecified drugs, medicaments and biological substances, intentional self-harm, initial encounter: Secondary | ICD-10-CM

## 2018-11-01 LAB — CBC WITH DIFFERENTIAL/PLATELET
Abs Immature Granulocytes: 0.1 10*3/uL — ABNORMAL HIGH (ref 0.00–0.07)
Basophils Absolute: 0.1 10*3/uL (ref 0.0–0.1)
Basophils Relative: 1 %
Eosinophils Absolute: 0.2 10*3/uL (ref 0.0–0.5)
Eosinophils Relative: 2 %
HCT: 44.5 % (ref 36.0–46.0)
HEMOGLOBIN: 14.5 g/dL (ref 12.0–15.0)
Immature Granulocytes: 1 %
Lymphocytes Relative: 28 %
Lymphs Abs: 3.9 10*3/uL (ref 0.7–4.0)
MCH: 29.1 pg (ref 26.0–34.0)
MCHC: 32.6 g/dL (ref 30.0–36.0)
MCV: 89.2 fL (ref 80.0–100.0)
Monocytes Absolute: 1 10*3/uL (ref 0.1–1.0)
Monocytes Relative: 7 %
Neutro Abs: 8.8 10*3/uL — ABNORMAL HIGH (ref 1.7–7.7)
Neutrophils Relative %: 61 %
Platelets: 390 10*3/uL (ref 150–400)
RBC: 4.99 MIL/uL (ref 3.87–5.11)
RDW: 12.4 % (ref 11.5–15.5)
WBC: 14.1 10*3/uL — ABNORMAL HIGH (ref 4.0–10.5)
nRBC: 0 % (ref 0.0–0.2)

## 2018-11-01 LAB — BASIC METABOLIC PANEL
Anion gap: 6 (ref 5–15)
BUN: 9 mg/dL (ref 6–20)
CALCIUM: 8.7 mg/dL — AB (ref 8.9–10.3)
CO2: 24 mmol/L (ref 22–32)
Chloride: 110 mmol/L (ref 98–111)
Creatinine, Ser: 0.52 mg/dL (ref 0.44–1.00)
GFR calc Af Amer: >60 mL/min (ref >60)
GFR calc non Af Amer: >60 mL/min (ref >60)
Glucose, Bld: 94 mg/dL (ref 70–99)
Potassium: 3.6 mmol/L (ref 3.5–5.1)
Sodium: 140 mmol/L (ref 135–145)

## 2018-11-01 LAB — ACETAMINOPHEN LEVEL
Acetaminophen (Tylenol), Serum: <10 ug/mL — ABNORMAL LOW (ref 10–30)
Acetaminophen (Tylenol), Serum: <10 ug/mL — ABNORMAL LOW (ref 10–30)

## 2018-11-01 LAB — COMPREHENSIVE METABOLIC PANEL
ALT: 20 U/L (ref 0–44)
AST: 23 U/L (ref 15–41)
Albumin: 4.5 g/dL (ref 3.5–5.0)
Alkaline Phosphatase: 114 U/L (ref 38–126)
Anion gap: 6 (ref 5–15)
BUN: 9 mg/dL (ref 6–20)
CO2: 26 mmol/L (ref 22–32)
Calcium: 9.3 mg/dL (ref 8.9–10.3)
Chloride: 106 mmol/L (ref 98–111)
Creatinine, Ser: 0.58 mg/dL (ref 0.44–1.00)
GFR calc Af Amer: >60 mL/min (ref >60)
GFR calc non Af Amer: >60 mL/min (ref >60)
Glucose, Bld: 116 mg/dL — ABNORMAL HIGH (ref 70–99)
POTASSIUM: 3.6 mmol/L (ref 3.5–5.1)
Sodium: 138 mmol/L (ref 135–145)
Total Bilirubin: 0.4 mg/dL (ref 0.3–1.2)
Total Protein: 7.8 g/dL (ref 6.5–8.1)

## 2018-11-01 LAB — ETHANOL: Alcohol, Ethyl (B): <10 mg/dL (ref <10)

## 2018-11-01 LAB — URINE DRUG SCREEN, QUALITATIVE (ARMC ONLY)
Amphetamines, Ur Screen: NOT DETECTED
Barbiturates, Ur Screen: NOT DETECTED
Benzodiazepine, Ur Scrn: NOT DETECTED
CANNABINOID 50 NG, UR ~~LOC~~: NOT DETECTED
Cocaine Metabolite,Ur ~~LOC~~: NOT DETECTED
MDMA (Ecstasy)Ur Screen: NOT DETECTED
Methadone Scn, Ur: NOT DETECTED
Opiate, Ur Screen: NOT DETECTED
Phencyclidine (PCP) Ur S: NOT DETECTED
Tricyclic, Ur Screen: NOT DETECTED

## 2018-11-01 LAB — URINALYSIS, COMPLETE (UACMP) WITH MICROSCOPIC
Bilirubin Urine: NEGATIVE
Glucose, UA: NEGATIVE mg/dL
KETONES UR: NEGATIVE mg/dL
Nitrite: NEGATIVE
Protein, ur: NEGATIVE mg/dL
Specific Gravity, Urine: 1.009 (ref 1.005–1.030)
pH: 7 (ref 5.0–8.0)

## 2018-11-01 LAB — PREGNANCY, URINE: Preg Test, Ur: NEGATIVE

## 2018-11-01 LAB — TSH: TSH: 1.765 u[IU]/mL (ref 0.350–4.500)

## 2018-11-01 LAB — SALICYLATE LEVEL
Salicylate Lvl: <7 mg/dL (ref 2.8–30.0)
Salicylate Lvl: <7 mg/dL (ref 2.8–30.0)

## 2018-11-01 MED ORDER — ALUM & MAG HYDROXIDE-SIMETH 200-200-20 MG/5ML PO SUSP: 30.0000 mL | ORAL | Status: DC | PRN

## 2018-11-01 MED ORDER — CEPHALEXIN 500 MG PO CAPS
500.0000 mg | ORAL_CAPSULE | Freq: Three times a day (TID) | ORAL | Status: DC
  Administered 2018-11-02 (×2): 500 mg via ORAL
  Filled 2018-11-01 (×2): qty 1

## 2018-11-01 MED ORDER — MAGNESIUM HYDROXIDE 400 MG/5ML PO SUSP: 30.0000 mL | Freq: Every day | ORAL | Status: DC | PRN

## 2018-11-01 MED ORDER — SODIUM CHLORIDE 0.9 % IV BOLUS
1000.0000 mL | Freq: Once | INTRAVENOUS | Status: AC
  Administered 2018-11-01: 1000 mL via INTRAVENOUS

## 2018-11-01 MED ORDER — PHENAZOPYRIDINE HCL 100 MG PO TABS
100.0000 mg | ORAL_TABLET | Freq: Three times a day (TID) | ORAL | Status: DC | PRN
  Filled 2018-11-01: qty 1

## 2018-11-01 MED ORDER — HYDROXYZINE HCL 25 MG PO TABS: 25.0000 mg | ORAL_TABLET | Freq: Three times a day (TID) | ORAL | Status: DC | PRN

## 2018-11-01 MED ORDER — ACETAMINOPHEN 325 MG PO TABS: 650.0000 mg | ORAL_TABLET | Freq: Four times a day (QID) | ORAL | Status: DC | PRN

## 2018-11-01 MED ORDER — PANTOPRAZOLE SODIUM 40 MG IV SOLR
40.0000 mg | Freq: Once | INTRAVENOUS | Status: AC
  Administered 2018-11-01: 40 mg via INTRAVENOUS
  Filled 2018-11-01: qty 40

## 2018-11-01 MED ORDER — DIPHENHYDRAMINE HCL 25 MG PO CAPS: 25.0000 mg | ORAL_CAPSULE | Freq: Four times a day (QID) | ORAL | Status: DC | PRN

## 2018-11-01 NOTE — BHH Counselor (Signed)
Patient is to be admitted to [facility] by Dr. Viviano Simas.  Attending Physician will be Dr. Jennet Maduro.   Patient has been assigned to room 302, by [facility] Charge Nurse Lillette Boxer   ER staff is aware of the admission:  Dr. Derrill Kay, ER MD

## 2018-11-01 NOTE — ED Notes (Signed)
Officer at bedside talking with pt

## 2018-11-01 NOTE — ED Notes (Addendum)
Gave brief report to psych.

## 2018-11-01 NOTE — ED Notes (Signed)
Pt given snack. 

## 2018-11-01 NOTE — ED Notes (Signed)
Pt became tearful when notified she will be staying in the hospital in the behavioral unit.
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
## 2018-11-01 NOTE — ED Notes (Signed)
TV turned on for pt.

## 2018-11-01 NOTE — ED Notes (Addendum)
Mother at bedside for visit. Pt requesting to have partner come back. This RN refusing to allow partner to visit as she is the stated reason per pt that she attempted to kill herself. Pt talking about getting up and leaving. Educated MD is placing pt under IVC and that she will need to stay until order discontinued. Understands she must be evaluated by a psych doc.

## 2018-11-01 NOTE — ED Notes (Signed)
Pt anxious and states "my hands feel tingly." Pt proceeds to fling arms around in air so as to shake them out. EDP Derrill Kay notified in person. Pt anxious to leave. Educated on this process but remains anxious.

## 2018-11-01 NOTE — Progress Notes (Signed)
Patient ID: Destiny [name], female   DOB: 08-07-2001, 18 y.o.   MRN: [id number] Presents with increased depression and recent thoughts of harming herself. Per ED report, patient presented to ED after overdosing on Flexiril and Diclofenac. However, patient currently denying and saying that "I was just overwhelmed but not intending to kill myself". Patient reports that she currently has a job and a girlfriend who is supportive. However, patient also states that she has been having some issues with the same girlfriend. Patient states that she is not willing to stay with the girlfriend upon discharge. Planning to stay with her mother who is currently her primary support. Currently denying suicidal thoughts, minimizing her mental health condition "I think they overreacted, I was just feeling overwhelmed". Cooperative during admission. Skin assessment performed by this Clinical research associate, assisted by [place], RN. No skin issues noted. Has three piercing (nose, tongue and belly button). No sign of infection. Admitted and oriented to the unit. Safety precautions initiated. To be evaluated by attending provider in AM.

## 2018-11-01 NOTE — ED Notes (Signed)
Veronique RN in [facility] given report. Pt will be going to room 302.

## 2018-11-01 NOTE — ED Notes (Signed)
Pt given food tray and drink.  

## 2018-11-01 NOTE — ED Notes (Signed)
Pt given more warm blankets.  

## 2018-11-01 NOTE — ED Notes (Signed)
Poison control updated. No other recommendations.

## 2018-11-01 NOTE — ED Notes (Signed)
Pt sleeping. 

## 2018-11-01 NOTE — ED Notes (Signed)
Pt up to bedside toilet.  

## 2018-11-01 NOTE — ED Notes (Signed)
Pt denies wanting to hurt herself currently.

## 2018-11-01 NOTE — ED Notes (Signed)
Pt being dressed out now. Currently providing a urine sample.
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
## 2018-11-01 NOTE — ED Notes (Signed)
Pt on telephone and watching tv.
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
## 2018-11-01 NOTE — ED Notes (Signed)
Pt now reporting medial CP. EDP Schaevitz notified in person. No new orders.

## 2018-11-01 NOTE — Tx Team (Signed)
Initial Treatment Plan 11/01/2018 10:50 PM [name] PPJ:[id number]    PATIENT STRESSORS: Marital or family conflict Other: relationship issues   PATIENT STRENGTHS: Ability for insight Communication skills General fund of knowledge Supportive family/friends Work skills   PATIENT IDENTIFIED PROBLEMS: Depression  Suicidal thoughts                   DISCHARGE CRITERIA:  Ability to meet basic life and health needs Improved stabilization in mood, thinking, and/or behavior Motivation to continue treatment in a less acute level of care Verbal commitment to aftercare and medication compliance  PRELIMINARY DISCHARGE PLAN: Outpatient therapy Participate in family therapy Return to previous work or school arrangements  PATIENT/FAMILY INVOLVEMENT: This treatment plan has been presented to and reviewed with the patient, Destiny [name]. The patient has been given the opportunity to ask questions and make suggestions.  Olin Pia, RN 11/01/2018, 10:50 PM

## 2018-11-01 NOTE — ED Notes (Addendum)
Pt has fitbit-like watch and clothes in bag at nurses station. Labeled with pt's name. Pt agrees to let mom visit her. Room cleared of everything except heart monitor as pt not yet medically cleared.

## 2018-11-01 NOTE — ED Notes (Signed)
Pt resting in bed with door open and tv on.

## 2018-11-01 NOTE — ED Notes (Signed)
EKG completed at 1422.

## 2018-11-01 NOTE — ED Notes (Signed)
Pt asleep in bed.

## 2018-11-01 NOTE — ED Notes (Signed)
Poison control called to receive recommendations. State to keep using heart monitor for 6 hrs post consumption, IV fluids, and recheck labs (Tylenol, ASA, electrolytes) in 3hrs (at 6pm). EDP Schaevitz notified in person. State ST & nausea will likely be the worst symptoms seen; will continue to keep updated.

## 2018-11-01 NOTE — BH Assessment (Signed)
Assessment Note  Destiny Destiny [name] is an 18 y.o. female who presents to the ER due to taking an overdose of medications as a suicide attempt, following an argument with her lover. Patient currently reports she is okay and would like to go home. However, patient is unable to share her thought process before taking the medications and she was unable to share what would prevent her from trying to end her life again.  She have a history of poor impulsive control and minimizing the consequences of her behaviors. This past December (2019), the patient drank a large amount of alcohol that required medical attention. Patient's grandmother passed away and she was having a difficult time coping with it.   During the interview, the patient was polite and tearful. She has poor insight into what happen and her depression. She continued to say she wanted to go home but was unable to share any plans that would ensure her safety.   Diagnosis: Depression  Past Medical History:  Past Medical History:  Diagnosis Date  . Asthma   . Insomnia   . Ovarian cyst     Past Surgical History:  Procedure Laterality Date  . TONSILLECTOMY      Family History: History reviewed. No pertinent family history.  Social History:  reports that she is a non-smoker but has been exposed to tobacco smoke. She has never used smokeless tobacco. She reports that she does not drink alcohol or use drugs.  Additional Social History:  Alcohol / Drug Use Pain Medications: See PTA Prescriptions: See PTA Over the Counter: See PTA History of alcohol / drug use?: No history of alcohol / drug abuse Longest period of sobriety (when/how Destiny [name]): Reports of no use Negative Consequences of Use: (n/a) Withdrawal Symptoms: (n/a)  CIWA: CIWA-Ar BP: 117/74 Pulse Rate: (!) 117 COWS:    Allergies:  Allergies  Allergen Reactions  . Lorazepam Other (See Comments)    Feels funny  . Zofran [Ondansetron Hcl] Hives and Itching  . Zolpidem Other (See  Comments)    Feels funny    Home Medications: (Not in a hospital admission)   OB/GYN Status:  No LMP recorded. (Menstrual status: Oral contraceptives).  General Assessment Data [facility] Assessment: In system Is this a Tele or Face-to-Face Assessment?: Face-to-Face Is this an Initial Assessment or a Re-assessment for this encounter?: Initial Assessment Patient Accompanied by:: N/A Language Other than English: No Living Arrangements: Other (Comment)(Private home) What gender do you identify as?: Female Marital status: Destiny [name] term relationship Pregnancy Status: No Living Arrangements: Parent Can pt return to current living arrangement?: Yes Admission Status: Involuntary Petitioner: ED Attending Is patient capable of signing voluntary admission?: No(Under IVC) Referral Source: Self/Family/Friend Insurance type: None  Medical Screening Exam [facility] Walk-in ONLY) Medical Exam completed: Yes  Crisis Care Plan Living Arrangements: Parent Legal Guardian: Other:(Self) Name of Psychiatrist: Reports of none Name of Therapist: Reports of none  Education Status Is patient currently in school?: No Current Grade: College Highest grade of school patient has completed: High School Name of school: [employer] Contact person: n/a IEP information if applicable: n/a Is the patient employed, unemployed or receiving disability?: Employed  Risk to self with the past 6 months Suicidal Ideation: No-Not Currently/Within Last 6 Months Has patient been a risk to self within the past 6 months prior to admission? : Yes Suicidal Intent: No-Not Currently/Within Last 6 Months Has patient had any suicidal intent within the past 6 months prior to admission? : Yes  Is patient at risk for suicide?: Yes Suicidal Plan?: No-Not Currently/Within Last 6 Months Has patient had any suicidal plan within the past 6 months prior to admission? : Yes Access to Means: Yes Specify Access to  Suicidal Means: Medications What has been your use of drugs/alcohol within the last 12 months?: Reports of none Previous Attempts/Gestures: No How many times?: 1 Other Self Harm Risks: Reports of none Triggers for Past Attempts: None known Intentional Self Injurious Behavior: None Family Suicide History: No Recent stressful life event(s): Conflict (Comment), Other (Comment), Financial Problems Persecutory voices/beliefs?: No Depression: Yes Depression Symptoms: Feeling angry/irritable, Isolating Substance abuse history and/or treatment for substance abuse?: No Suicide prevention information given to non-admitted patients: Not applicable  Risk to Others within the past 6 months Homicidal Ideation: No Does patient have any lifetime risk of violence toward others beyond the six months prior to admission? : No Thoughts of Harm to Others: No Current Homicidal Intent: No Current Homicidal Plan: No Access to Homicidal Means: No Identified Victim: Reports of none History of harm to others?: No Assessment of Violence: None Noted Violent Behavior Description: Reports of none Does patient have access to weapons?: No Criminal Charges Pending?: No Does patient have a court date: No Is patient on probation?: No  Psychosis Hallucinations: None noted Delusions: None noted  Mental Status Report Appearance/Hygiene: Unremarkable, In scrubs Eye Contact: Good Motor Activity: Unable to assess Speech: Logical/coherent, Unremarkable Level of Consciousness: Alert Mood: Depressed, Anxious, Pleasant, Despair Affect: Appropriate to circumstance, Depressed, Sad Anxiety Level: Minimal Thought Processes: Coherent, Relevant Judgement: Unimpaired Orientation: Person, Place, Time, Situation, Appropriate for developmental age Obsessive Compulsive Thoughts/Behaviors: Minimal  Cognitive Functioning Concentration: Normal Memory: Recent Intact, Remote Intact Is patient IDD: No Insight: Fair Impulse  Control: Fair Appetite: Fair Have you had any weight changes? : No Change Sleep: Decreased Total Hours of Sleep: 4(Trouble falling & staying sleep.) Vegetative Symptoms: None  ADLScreening [facility] Assessment Services) Patient's cognitive ability adequate to safely complete daily activities?: Yes Patient able to express need for assistance with ADLs?: Yes Independently performs ADLs?: Yes (appropriate for developmental age)  Prior Inpatient Therapy Prior Inpatient Therapy: No  Prior Outpatient Therapy Prior Outpatient Therapy: Yes Prior Therapy Dates: 2019 Prior Therapy Facilty/Provider(s): [facility] Reason for Treatment: Depression Does patient have an ACCT team?: No Does patient have Intensive In-House Services?  : No Does patient have [facility] services? : No Does patient have P4CC services?: No  ADL Screening (condition at time of admission) Patient's cognitive ability adequate to safely complete daily activities?: Yes Is the patient deaf or have difficulty hearing?: No Does the patient have difficulty seeing, even when wearing glasses/contacts?: No Does the patient have difficulty concentrating, remembering, or making decisions?: No Patient able to express need for assistance with ADLs?: Yes Does the patient have difficulty dressing or bathing?: No Independently performs ADLs?: Yes (appropriate for developmental age) Does the patient have difficulty walking or climbing stairs?: No Weakness of Legs: None Weakness of Arms/Hands: None  Home Assistive Devices/Equipment Home Assistive Devices/Equipment: None  Therapy Consults (therapy consults require a physician order) PT Evaluation Needed: No OT Evalulation Needed: No SLP Evaluation Needed: No Abuse/Neglect Assessment (Assessment to be complete while patient is alone) Abuse/Neglect Assessment Can Be Completed: Yes Physical Abuse: Denies Verbal Abuse: Denies Sexual Abuse: Denies Exploitation of patient/patient's  resources: Denies Self-Neglect: Denies Values / Beliefs Cultural Requests During Hospitalization: None Spiritual Requests During Hospitalization: None Consults Spiritual Care Consult Needed: No Social Work Consult Needed: No Merchant [employer] officer (  For Healthcare) Does Patient Have a Medical Advance Directive?: No Would patient like information on creating a medical advance directive?: No - Patient declined       Child/Adolescent Assessment Running Away Risk: Denies(Patient is an adult)  Disposition:  Disposition Initial Assessment Completed for this Encounter: Yes  On Site Evaluation by:   Reviewed with Physician:    Lilyan Gilfordalvin J. Eliane Hammersmith MS, LCAS, LPC, NCC, CCSI Therapeutic Triage Specialist 11/01/2018 5:40 PM

## 2018-11-01 NOTE — Plan of Care (Signed)
Newly admitted and adjusting well to the unit. Denying thoughts of self harm.

## 2018-11-02 ENCOUNTER — Encounter: Payer: Self-pay | Admitting: Psychiatry

## 2018-11-02 DIAGNOSIS — F332 Major depressive disorder, recurrent severe without psychotic features: Principal | ICD-10-CM

## 2018-11-02 DIAGNOSIS — T50901A Poisoning by unspecified drugs, medicaments and biological substances, accidental (unintentional), initial encounter: Secondary | ICD-10-CM | POA: Diagnosis present

## 2018-11-02 MED ORDER — HYDROXYZINE HCL 25 MG PO TABS: 25.0000 mg | ORAL_TABLET | Freq: Three times a day (TID) | ORAL | 0 refills | Status: DC | PRN

## 2018-11-02 MED ORDER — SERTRALINE HCL 50 MG PO TABS: 50.0000 mg | ORAL_TABLET | Freq: Every day | ORAL | 1 refills | Status: DC

## 2018-11-02 MED ORDER — SERTRALINE HCL 25 MG PO TABS
50.0000 mg | ORAL_TABLET | Freq: Every day | ORAL | Status: DC
  Administered 2018-11-02: 50 mg via ORAL
  Filled 2018-11-02: qty 2
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
## 2018-11-02 NOTE — Progress Notes (Signed)
Recreation Therapy Notes  Date: 11/02/2018  Time: 9:30 am   Location: Craft room   Behavioral response: N/A   Intervention Topic: Problem Solving  Discussion/Intervention: Patient did not attend group.   Clinical Observations/Feedback:  Patient did not attend group.   Kahiau Schewe LRT/CTRS        Amery Vandenbos 11/02/2018 11:27 AM

## 2018-11-02 NOTE — [facility] Suicide Risk Assessment (Signed)
[facility] INPATIENT:  Family/Significant Other Suicide Prevention Education  Suicide Prevention Education:  Education Completed; Destiny [name], mother [phone] has been identified by the patient as the family member/significant other with whom the patient will be residing, and identified as the person(s) who will aid the patient in the event of a mental health crisis (suicidal ideations/suicide attempt).  With written consent from the patient, the family member/significant other has been provided the following suicide prevention education, prior to the and/or following the discharge of the patient.  The suicide prevention education provided includes the following:  Suicide risk factors  Suicide prevention and interventions  National Suicide Hotline telephone number  [facility] assessment telephone number  [facility] Emergency Assistance 911  [facility] and/or Residential Mobile Crisis Unit telephone number  Request made of family/significant other to:  Remove weapons (e.g., guns, rifles, knives), all items previously/currently identified as safety concern.    Remove drugs/medications (over-the-counter, prescriptions, illicit drugs), all items previously/currently identified as a safety concern.  The family member/significant other verbalizes understanding of the suicide prevention education information provided.  The family member/significant other agrees to remove the items of safety concern listed above. [name] does not report any reservation about pt returning to the home and denies pt having access to guns or weapons. She request pt be given information on grief support groups due to pt grandmother recently transitioning. CSW will provide pt with this information. Destiny [name] informed if pt needs further medical attention to call 911 or bring her to the nearest emergency department. Destiny [name] 11/02/2018, 11:14 AM
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
## 2018-11-02 NOTE — BHH Suicide Risk Assessment (Signed)
[facility] Discharge Suicide Risk Assessment   Principal Problem: MDD (major depressive disorder), recurrent severe, without psychosis (HCC) Discharge Diagnoses: Principal Problem:   MDD (major depressive disorder), recurrent severe, without psychosis (HCC) Active Problems:   Overdose   Total Time spent with patient: 1 hour  Musculoskeletal: Strength & Muscle Tone: within normal limits Gait & Station: normal Patient leans: N/A  Psychiatric Specialty Exam: Review of Systems  Neurological: Negative.   Psychiatric/Behavioral: Negative.   All other systems reviewed and are negative.   Blood pressure 117/78, pulse 95, temperature 97.6 F (36.4 C), temperature source Oral, resp. rate 18, height 5\' 5"  (1.651 m), weight 78.9 kg, SpO2 98 %.Body mass index is 28.96 kg/m.  General Appearance: Casual  Eye Contact::  Good  Speech:  Clear and Coherent409  Volume:  Normal  Mood:  Euthymic  Affect:  Appropriate  Thought Process:  Goal Directed and Descriptions of Associations: Intact  Orientation:  Full (Time, Place, and Person)  Thought Content:  WDL  Suicidal Thoughts:  No  Homicidal Thoughts:  No  Memory:  Immediate;   Fair Recent;   Fair Remote;   Fair  Judgement:  Impaired  Insight:  Present  Psychomotor Activity:  Normal  Concentration:  Fair  Recall:  [employer] of Knowledge:Fair  Language: Fair  Akathisia:  No  Handed:  Right  AIMS (if indicated):     Assets:  Communication Skills Desire for Improvement Housing Physical Health Resilience Social Support Transportation Vocational/Educational  Sleep:  Number of Hours: 7  Cognition: WNL  ADL's:  Intact   Mental Status Per Nursing Assessment::   On Admission:  NA  Demographic Factors:  Caucasian and Gay, lesbian, or bisexual orientation  Loss Factors: Loss of significant relationship  Historical Factors: Impulsivity  Risk Reduction Factors:   Sense of responsibility to family, Employed, Living with another  person, especially a relative and Positive social support  Continued Clinical Symptoms:  Depression:   Impulsivity  Cognitive Features That Contribute To Risk:  None    Suicide Risk:  Minimal: No identifiable suicidal ideation.  Patients presenting with no risk factors but with morbid ruminations; may be classified as minimal risk based on the severity of the depressive symptoms  Follow-up Information    [facility] Go on 11/08/2018.   Why:  Please follow up at [facility] on Monday, November 08, 2018 at 715am: Unk Pinto will meet you there for peer support service. Please bring photo ID, insurance card, medication. Thank you. Contact information: [address] [place] [address] [phone]           Plan Of Care/Follow-up recommendations:  Activity:  as tolerated Diet:  regular Other:  keep follow up appointments  Kristine Linea, MD 11/02/2018, 11:04 AM
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
## 2018-11-02 NOTE — Progress Notes (Signed)
  [facility] :  Will you be returning to the same living situation after discharge:  Yes,  lives with parent At discharge, do you have transportation home?: Yes,  mother will pick up at 2pm Do you have the ability to pay for your medications: No. pt referred to provider who can assist with medication  Release of information consent forms completed and in the chart;  Patient's signature needed at discharge.  Patient to Follow up at: Follow-up Information    [facility] Go on 11/08/2018.   Why:  [facility] Monday, November 08, 2018 at 715am: Unk PintoHarvey Bryant will meet you there for peer support service. Please bring photo ID, insurance card, medication. Thank you. Contact information: [address] [place] [phone]           Next level of care provider has access to [facility] Link:no  Safety Planning and Suicide Prevention discussed: Yes,  with pt, mother Onnie BoerJennifer Clark  Have you used any form of tobacco in the last 30 days? (Cigarettes, Smokeless Tobacco, Cigars, and/or Pipes): No  Has patient been referred to the Quitline?: N/A patient is not a smoker  Patient has been referred for addiction treatment: N/A  Suzan SlickDARREN T Arnav Cregg, LCSW 11/02/2018, 11:09 AM

## 2018-11-02 NOTE — BHH Counselor (Signed)
Pt scheduled for discharge on 11/02/2018, therefore PSA not required to be completed.

## 2018-11-02 NOTE — Plan of Care (Signed)
Patient is alert and oriented X 4, Denies SI, HI and AVH. Patient states," Before I came down here I told them I did not want to hurt myself or anybody I just want to go home." Patient did not want to eat any breakfast this morning. Patient affect is flat, guarded, cautious. Patient understood her medication this morning was an antibiotic for UTI. " Yeah I know I have been taking it for my urinary infection." Safety checks to continue Q 15 minutes. Problem: Education: Goal: Knowledge of [place] [facility] Education information/materials will improve Outcome: Not Progressing   Problem: Education: Goal: Utilization of techniques to improve thought processes will improve Outcome: Not Progressing Goal: Knowledge of the prescribed therapeutic regimen will improve Outcome: Not Progressing   Problem: Education: Goal: Ability to state activities that reduce stress will improve Outcome: Not Progressing

## 2019-05-06 ENCOUNTER — Emergency Department: Payer: Self-pay

## 2019-05-06 ENCOUNTER — Other Ambulatory Visit: Payer: Self-pay

## 2019-05-06 ENCOUNTER — Encounter: Payer: Self-pay | Admitting: Emergency Medicine

## 2019-05-06 ENCOUNTER — Emergency Department
Admission: EM | Admit: 2019-05-06 | Discharge: 2019-05-06 | Disposition: A | Discharge status: Home / Self Care | Payer: Self-pay | Attending: Student | Admitting: Student

## 2019-05-06 DIAGNOSIS — Z7722 Contact with and (suspected) exposure to environmental tobacco smoke (acute) (chronic): Secondary | ICD-10-CM | POA: Insufficient documentation

## 2019-05-06 DIAGNOSIS — N390 Urinary tract infection, site not specified: Secondary | ICD-10-CM | POA: Insufficient documentation

## 2019-05-06 DIAGNOSIS — K529 Noninfective gastroenteritis and colitis, unspecified: Secondary | ICD-10-CM | POA: Insufficient documentation

## 2019-05-06 DIAGNOSIS — J45909 Unspecified asthma, uncomplicated: Secondary | ICD-10-CM | POA: Insufficient documentation

## 2019-05-06 DIAGNOSIS — Z79899 Other long term (current) drug therapy: Secondary | ICD-10-CM | POA: Insufficient documentation

## 2019-05-06 LAB — COMPREHENSIVE METABOLIC PANEL
ALT: 15 U/L (ref 0–44)
AST: 21 U/L (ref 15–41)
Albumin: 4.6 g/dL (ref 3.5–5.0)
Alkaline Phosphatase: 115 U/L (ref 38–126)
Anion gap: 9 (ref 5–15)
BUN: 10 mg/dL (ref 6–20)
CO2: 23 mmol/L (ref 22–32)
Calcium: 9.5 mg/dL (ref 8.9–10.3)
Chloride: 108 mmol/L (ref 98–111)
Creatinine, Ser: 0.73 mg/dL (ref 0.44–1.00)
GFR calc Af Amer: >60 mL/min (ref >60)
GFR calc non Af Amer: >60 mL/min (ref >60)
Glucose, Bld: 125 mg/dL — ABNORMAL HIGH (ref 70–99)
Potassium: 3.4 mmol/L — ABNORMAL LOW (ref 3.5–5.1)
Sodium: 140 mmol/L (ref 135–145)
Total Bilirubin: 0.9 mg/dL (ref 0.3–1.2)
Total Protein: 8 g/dL (ref 6.5–8.1)

## 2019-05-06 LAB — CBC
HCT: 44.8 % (ref 36.0–46.0)
Hemoglobin: 15 g/dL (ref 12.0–15.0)
MCH: 28.8 pg (ref 26.0–34.0)
MCHC: 33.5 g/dL (ref 30.0–36.0)
MCV: 86 fL (ref 80.0–100.0)
Platelets: 361 10*3/uL (ref 150–400)
RBC: 5.21 MIL/uL — ABNORMAL HIGH (ref 3.87–5.11)
RDW: 13.2 % (ref 11.5–15.5)
WBC: 11.6 10*3/uL — ABNORMAL HIGH (ref 4.0–10.5)
nRBC: 0 % (ref 0.0–0.2)

## 2019-05-06 LAB — URINALYSIS, COMPLETE (UACMP) WITH MICROSCOPIC
Bilirubin Urine: NEGATIVE
Glucose, UA: NEGATIVE mg/dL
Hgb urine dipstick: NEGATIVE
Ketones, ur: NEGATIVE mg/dL
Leukocytes,Ua: NEGATIVE
Nitrite: POSITIVE — AB
Protein, ur: 30 mg/dL — AB
Specific Gravity, Urine: 1.016 (ref 1.005–1.030)
pH: 5 (ref 5.0–8.0)

## 2019-05-06 LAB — POCT PREGNANCY, URINE: Preg Test, Ur: NEGATIVE

## 2019-05-06 LAB — LIPASE, BLOOD: Lipase: 23 U/L (ref 11–51)

## 2019-05-06 MED ORDER — SODIUM CHLORIDE 0.9% FLUSH: 3.0000 mL | Freq: Once | INTRAVENOUS | Status: DC

## 2019-05-06 MED ORDER — CIPROFLOXACIN 500 MG/5ML (10%) PO SUSR: 500.0000 mg | Freq: Once | ORAL | Status: DC

## 2019-05-06 MED ORDER — CIPROFLOXACIN HCL 500 MG PO TABS: 500.0000 mg | ORAL_TABLET | Freq: Two times a day (BID) | ORAL | 0 refills | Status: AC

## 2019-05-06 MED ORDER — IOHEXOL 300 MG/ML  SOLN
100.0000 mL | Freq: Once | INTRAMUSCULAR | Status: AC | PRN
  Administered 2019-05-06: 100 mL via INTRAVENOUS
  Filled 2019-05-06: qty 100

## 2019-05-06 MED ORDER — CIPROFLOXACIN HCL 500 MG PO TABS
500.0000 mg | ORAL_TABLET | Freq: Once | ORAL | Status: AC
  Administered 2019-05-06: 21:00:00 500 mg via ORAL
  Filled 2019-05-06: qty 1

## 2019-05-06 NOTE — ED Provider Notes (Signed)
[facility] Emergency Department Provider Note  ____________________________________________   First MD Initiated Contact with Patient 05/06/19 1849     (approximate)  I have reviewed the triage vital signs and the nursing notes.   HISTORY  Chief Complaint Abdominal Pain and Back Pain    HPI Destiny [name] is a 18 y.o. female presents emergency department complaint of intermittent lower abdominal pain.  States had same episode about 1 month ago and it did not return to until today.  States also having lower back pain.  Some vomiting.  Pain got worse after eating.  She denies any fever or chills.  She denies any cough or congestion.  She denies any burning with urination.  States she does have a history of UTIs.  She took Azo this morning which did not help.    Past Medical History:  Diagnosis Date  . Asthma   . Insomnia   . Ovarian cyst     Patient Active Problem List   Diagnosis Date Noted  . Overdose 11/02/2018  . 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 At high risk for self harm 11/01/2018  . MDD (major depressive disorder), recurrent severe, without psychosis (HCC) 11/01/2018  . Moderate major depression, single episode (HCC) 09/22/2018  . Alcohol abuse 09/22/2018  . Substance induced mood disorder (HCC) 09/22/2018    Past Surgical History:  Procedure Laterality Date  . TONSILLECTOMY      Prior to Admission medications   Medication Sig Start Date End Date Taking? Authorizing Provider  ciprofloxacin (CIPRO) 500 MG tablet Take 1 tablet (500 mg total) by mouth 2 (two) times daily for 7 days. 05/06/19 05/13/19  Fisher, Roselyn BeringSusan W, PA-C  etonogestrel (NEXPLANON) 68 MG IMPL implant 1 each by Subdermal route once.    [provider]  hydrOXYzine (ATARAX/VISTARIL) 25 MG tablet Take 1 tablet (25 mg total) by mouth 3 (three) times daily as needed for anxiety. 11/02/18   Pucilowska, Braulio ConteJolanta B,  MD  sertraline (ZOLOFT) 50 MG tablet Take 1 tablet (50 mg total) by mouth daily. 11/02/18   Pucilowska, Ellin GoodieJolanta B, MD    Allergies Lorazepam, Zofran [ondansetron hcl], and Zolpidem  No family history on file.  Social History Social History   Tobacco Use  . Smoking status: Passive Smoke Exposure - Never Smoker  . Smokeless tobacco: Never Used  Substance Use Topics  . Alcohol use: No  . Drug use: No    Review of Systems  Constitutional: No fever/chills Eyes: No visual changes. ENT: No sore throat. Respiratory: Denies cough Gastrointestinal: Positive for near abdominal pain medical radiates to the back Genitourinary: Negative for dysuria. Musculoskeletal: Negative for back pain. Skin: Negative for rash.    ____________________________________________   PHYSICAL EXAM:  VITAL SIGNS: ED Triage Vitals  Enc Vitals Group     BP 05/06/19 1534 102/61     Pulse Rate 05/06/19 1534 92     Resp 05/06/19 1534 16     Temp 05/06/19 1534 98.2 F (36.8 C)     Temp Source 05/06/19 1534 Oral     SpO2 05/06/19 1534 96 %     Weight 05/06/19 1531 185 lb (83.9 kg)     Height 05/06/19 1531 5\' 5"  (1.651 m)     Head Circumference --      Peak Flow --      Pain Score 05/06/19 1531 6     Pain Loc --      Pain  Edu? --      Excl. in GC? --     Constitutional: Alert and oriented. Well appearing and in no acute distress. Eyes: Conjunctivae are normal.  Head: Atraumatic. Nose: No congestion/rhinnorhea. Mouth/Throat: Mucous membranes are moist.   Neck:  supple no lymphadenopathy noted Cardiovascular: Normal rate, regular rhythm. Heart sounds are normal Respiratory: Normal respiratory effort.  No retractions, lungs c t a  Abd: soft tender in the right lower quadrant, Bs normal all 4 quad GU: deferred Musculoskeletal: FROM all extremities, warm and well perfused Neurologic:  Normal speech and language.  Skin:  Skin is warm, dry and intact. No rash noted. Psychiatric: Mood and affect  are normal. Speech and behavior are normal.  ____________________________________________   LABS (all labs ordered are listed, but only abnormal results are displayed)  Labs Reviewed  COMPREHENSIVE METABOLIC PANEL - Abnormal; Notable for the following components:      Result Value   Potassium 3.4 (*)    Glucose, Bld 125 (*)    All other components within normal limits  CBC - Abnormal; Notable for the following components:   WBC 11.6 (*)    RBC 5.21 (*)    All other components within normal limits  URINALYSIS, COMPLETE (UACMP) WITH MICROSCOPIC - Abnormal; Notable for the following components:   Color, Urine ORANGE (*)    APPearance HAZY (*)    Protein, ur 30 (*)    Nitrite POSITIVE (*)    Bacteria, UA MANY (*)    All other components within normal limits  LIPASE, BLOOD  POC URINE PREG, ED  POCT PREGNANCY, URINE   ____________________________________________   ____________________________________________  RADIOLOGY  CT abdomen/pelvis with IV contrast is negative for acute appendicitis but does show some colitis  ____________________________________________   PROCEDURES  Procedure(s) performed: Cipro 500 mg p.o.   Procedures    ____________________________________________   INITIAL IMPRESSION / ASSESSMENT AND PLAN / ED COURSE  Pertinent labs & imaging results that were available during my care of the patient were reviewed by me and considered in my medical decision making (see chart for details).   Patient is a 18 year old female presents emergency department with right mid quadrant pain that radiates to the back.  With physical exam patient appears well.  Nontoxic, skin right lower quadrant at this time palpation  DDX: uti, kidney stones, appendicitis, ovarian cyst  CBC with elevated WBC (6, metabolic panel is basically normal, urinalysis shows positive nitrates but no acute sites.  POC pregnancy is negative  Explained findings to the patient.  Due to the  right lower quadrant pain that we should rule out appendicitis.  She states there is slight tenderness she agrees with the plan.  She does get a layoff and CT abdomen/pelvis with IV contrast was ordered.   CT abdomen/pelvis was negative for acute appendicitis but does show colitis.  Explained findings to the patient.  Told her this could explain the large amount of nitrites in her urine which are usually caused by E. coli.  She was given a prescription for Cipro 500 twice daily for 7 days.  Tendon injury was discussed with her.  She is to follow-up with her regular doctor if not better in 3 days.  Return emergency department worsening.  States she understands will comply.  She is discharged stable condition.   Destiny [name] was evaluated in Emergency Department on 05/06/2019 for the symptoms described in the history of present illness. She was evaluated in the context of the global  COVID-19 pandemic, which necessitated consideration that the patient might be at risk for infection with the SARS-CoV-2 virus that causes COVID-19. Institutional protocols and algorithms that pertain to the evaluation of patients at risk for COVID-19 are in a state of rapid change based on information released by regulatory bodies including the CDC and federal and state organizations. These policies and algorithms were followed during the patient's care in the ED.   As part of my medical decision making, I reviewed the following data within the [facility] notes reviewed and incorporated, Labs reviewed see above, Old chart reviewed, Radiograph reviewed CT abdomen/pelvis negative for appendicitis, Notes from prior ED visits and [place] Controlled Substance Database  ____________________________________________   FINAL CLINICAL IMPRESSION(S) / ED DIAGNOSES  Final diagnoses:  Colitis  Urinary tract infection without hematuria, site unspecified      NEW MEDICATIONS STARTED DURING THIS VISIT:   New Prescriptions   CIPROFLOXACIN (CIPRO) 500 MG TABLET    Take 1 tablet (500 mg total) by mouth 2 (two) times daily for 7 days.     Note:  This document was prepared using Dragon voice recognition software and may include unintentional dictation errors.    Versie Starks, PA-C 05/06/19 2038    Lilia Pro., MD 05/07/19 [phone]

## 2019-05-06 NOTE — ED Notes (Signed)
  Pt transported to ct 

## 2019-05-06 NOTE — ED Triage Notes (Signed)
Pt to ED via POV c/o pain in her lower abd and her across her lower back. Pt states that she had similar pain about 1 month ago, called EMS but was not seen by a doctor. Pt states that pain started again this morning. Pt states that when she was trying to eat she felt like is was going to vomit. Pt states that her stomach has been "upset". Pt is in NAD at this time, pt is talking on her phone on video chat during triage.

## 2019-05-06 NOTE — ED Notes (Signed)
See triage note   Presents with intermittent lower abd pain  States she had an episode about 1 month ago    Afebrile on arrival

## 2019-05-06 NOTE — Discharge Instructions (Signed)
Follow-up with your regular doctor if not better in 3 days.  Return emergency department worsening.  Take medication as prescribed.  If your abdominal pain is not decreasing please follow-up with the GI doctors.  Dr. Marius Ditch was on call today and would be appropriate.

## 2019-05-09 ENCOUNTER — Telehealth: Payer: Self-pay | Admitting: Gastroenterology

## 2019-05-09 NOTE — Telephone Encounter (Signed)
Left vm to schedule ed f/u apt with Dr. Marius Ditch

## 2019-05-25 ENCOUNTER — Telehealth: Payer: Self-pay | Admitting: Gastroenterology

## 2019-05-25 NOTE — Telephone Encounter (Signed)
Left vm to offer apt with Dr. Vanga °

## 2019-06-02 ENCOUNTER — Encounter: Payer: Self-pay | Admitting: Gastroenterology

## 2019-06-11 ENCOUNTER — Other Ambulatory Visit: Payer: Self-pay

## 2019-06-11 ENCOUNTER — Encounter: Payer: Self-pay | Admitting: Emergency Medicine

## 2019-06-11 ENCOUNTER — Emergency Department
Admission: EM | Admit: 2019-06-11 | Discharge: 2019-06-11 | Disposition: A | Discharge status: Home / Self Care | Payer: Medicaid Other | Attending: Emergency Medicine | Admitting: Emergency Medicine

## 2019-06-11 DIAGNOSIS — N390 Urinary tract infection, site not specified: Secondary | ICD-10-CM | POA: Insufficient documentation

## 2019-06-11 DIAGNOSIS — Z7722 Contact with and (suspected) exposure to environmental tobacco smoke (acute) (chronic): Secondary | ICD-10-CM | POA: Insufficient documentation

## 2019-06-11 DIAGNOSIS — J45909 Unspecified asthma, uncomplicated: Secondary | ICD-10-CM | POA: Insufficient documentation

## 2019-06-11 LAB — URINALYSIS, COMPLETE (UACMP) WITH MICROSCOPIC
Bilirubin Urine: NEGATIVE
Glucose, UA: NEGATIVE mg/dL
Hgb urine dipstick: NEGATIVE
Ketones, ur: NEGATIVE mg/dL
Nitrite: NEGATIVE
Protein, ur: 30 mg/dL — AB
Specific Gravity, Urine: 1.029 (ref 1.005–1.030)
pH: 6 (ref 5.0–8.0)

## 2019-06-11 LAB — POCT PREGNANCY, URINE: Preg Test, Ur: NEGATIVE

## 2019-06-11 MED ORDER — SULFAMETHOXAZOLE-TRIMETHOPRIM 800-160 MG PO TABS
1.0000 | ORAL_TABLET | Freq: Once | ORAL | Status: AC
  Administered 2019-06-11: 1 via ORAL
  Filled 2019-06-11: qty 1

## 2019-06-11 MED ORDER — PHENAZOPYRIDINE HCL 100 MG PO TABS
100.0000 mg | ORAL_TABLET | Freq: Once | ORAL | Status: AC
  Administered 2019-06-11: 100 mg via ORAL
  Filled 2019-06-11: qty 1

## 2019-06-11 MED ORDER — SULFAMETHOXAZOLE-TRIMETHOPRIM 800-160 MG PO TABS: 1.0000 | ORAL_TABLET | Freq: Two times a day (BID) | ORAL | 0 refills | Status: DC

## 2019-06-11 MED ORDER — PHENAZOPYRIDINE HCL 100 MG PO TABS: 100.0000 mg | ORAL_TABLET | Freq: Three times a day (TID) | ORAL | 0 refills | Status: AC | PRN

## 2019-06-11 NOTE — ED Notes (Signed)
Pt provided with po fluids to encourage urination with consent of provider.

## 2019-06-11 NOTE — ED Triage Notes (Signed)
Pt to ED via POV c/o lower back pain and dysuria. Pt states that she has had a sense of urgency as well. Pt is in NAD at this time.

## 2019-06-11 NOTE — ED Notes (Signed)
Pt st unable to void "all day today".

## 2019-06-11 NOTE — Discharge Instructions (Signed)
Follow-up with your primary care provider or can no clinic acute care if any continued problems.  Increase fluids.  Tonight you were given the first dose of your antibiotic.  It is extremely important for you to take the entire 10-day course of antibiotics.  Also Pyridium was prescribed for you.  This medication will cause her urine to become a fluorescent yellow-orange color which is temporary.  This will help decrease the frequency or discomfort that you are having when you urinate.  You may also take Tylenol if needed for pain.  Return to the emergency department if any severe worsening of your symptoms.

## 2019-06-11 NOTE — ED Provider Notes (Signed)
[facility] Emergency Department Provider Note  ___________________________________________   First MD Initiated Contact with Patient 06/11/19 1904     (approximate)  I have reviewed the triage vital signs and the nursing notes.   HISTORY  Chief Complaint Dysuria and Back Pain   HPI Destiny [name] is a 18 y.o. female presents to the ED with complaint of low back pain and dysuria.  Patient states that she was seen 1 month ago for a UTI as well.  She states that she did not complete the entire course of antibiotics as she was feeling better.  She denies any fever, chills, nausea or vomiting.  Currently she rates her pain as 3/10.      Past Medical History:  Diagnosis Date  . Asthma   . Insomnia   . Ovarian cyst     Patient Active Problem List   Diagnosis Date Noted  . Overdose 11/02/2018  . 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 At high risk for self harm 11/01/2018  . MDD (major depressive disorder), recurrent severe, without psychosis (HCC) 11/01/2018  . Moderate major depression, single episode (HCC) 09/22/2018  . Alcohol abuse 09/22/2018  . Substance induced mood disorder (HCC) 09/22/2018    Past Surgical History:  Procedure Laterality Date  . TONSILLECTOMY      Prior to Admission medications   Medication Sig Start Date End Date Taking? Authorizing Provider  etonogestrel (NEXPLANON) 68 MG IMPL implant 1 each by Subdermal route once.    [provider]  hydrOXYzine (ATARAX/VISTARIL) 25 MG tablet Take 1 tablet (25 mg total) by mouth 3 (three) times daily as needed for anxiety. 11/02/18   Pucilowska, Braulio ConteJolanta B, MD  phenazopyridine (PYRIDIUM) 100 MG tablet Take 1 tablet (100 mg total) by mouth 3 (three) times daily as needed for pain. 06/11/19 06/10/20  Tommi RumpsSummers, Sopheap Basic L, PA-C  sertraline (ZOLOFT) 50 MG tablet Take 1 tablet (50 mg total) by mouth daily. 11/02/18   Pucilowska, Jolanta B,  MD  sulfamethoxazole-trimethoprim (BACTRIM DS) 800-160 MG tablet Take 1 tablet by mouth 2 (two) times daily. 06/11/19   Tommi RumpsSummers, Litsy Epting L, PA-C    Allergies Lorazepam, Zofran Frazier Richards[ondansetron hcl], and Zolpidem  No family history on file.  Social History Social History   Tobacco Use  . Smoking status: Passive Smoke Exposure - Never Smoker  . Smokeless tobacco: Never Used  Substance Use Topics  . Alcohol use: No  . Drug use: No    Review of Systems Constitutional: No fever/chills Cardiovascular: Denies chest pain. Respiratory: Denies shortness of breath. Gastrointestinal: No abdominal pain.  No nausea, no vomiting. Genitourinary: Positive for dysuria. Musculoskeletal: Positive for low back pain. Skin: Negative for rash. Neurological: Negative for headaches, focal weakness or numbness. ____________________________________________   PHYSICAL EXAM:  VITAL SIGNS: ED Triage Vitals  Enc Vitals Group     BP 06/11/19 1757 126/84     Pulse Rate 06/11/19 1757 100     Resp 06/11/19 1757 18     Temp 06/11/19 1757 98.4 F (36.9 C)     Temp Source 06/11/19 1757 Oral     SpO2 06/11/19 1757 97 %     Weight 06/11/19 1757 180 lb (81.6 kg)     Height 06/11/19 1757 5\' 5"  (1.651 m)     Head Circumference --      Peak Flow --      Pain Score 06/11/19 1804 3     Pain Loc --  Pain Edu? --      Excl. in [place]? --    Constitutional: Alert and oriented. Well appearing and in no acute distress. Eyes: Conjunctivae are normal.  Head: Atraumatic. Nose: No congestion/rhinnorhea. Mouth/Throat: Mucous membranes are moist.  Oropharynx non-erythematous. Neck: No stridor.   Cardiovascular: Normal rate, regular rhythm. Grossly normal heart sounds.  Good peripheral circulation. Respiratory: Normal respiratory effort.  No retractions. Lungs CTAB. Gastrointestinal: Soft and nontender. No distention.  No CVA tenderness. Musculoskeletal: Moves upper and lower extremities with any difficulty normal gait  was noted. Neurologic:  Normal speech and language. No gross focal neurologic deficits are appreciated. No gait instability. Skin:  Skin is warm, dry and intact. No rash noted. Psychiatric: Mood and affect are normal. Speech and behavior are normal.  ____________________________________________   LABS (all labs ordered are listed, but only abnormal results are displayed)  Labs Reviewed  URINALYSIS, COMPLETE (UACMP) WITH MICROSCOPIC - Abnormal; Notable for the following components:      Result Value   Color, Urine YELLOW (*)    APPearance HAZY (*)    Protein, ur 30 (*)    Leukocytes,Ua TRACE (*)    Bacteria, UA RARE (*)    All other components within normal limits  POC URINE PREG, ED  POCT PREGNANCY, URINE    PROCEDURES  Procedure(s) performed (including Critical Care):  Procedures   ____________________________________________   INITIAL IMPRESSION / ASSESSMENT AND PLAN / ED COURSE  As part of my medical decision making, I reviewed the following data within the electronic MEDICAL RECORD NUMBER Notes from prior ED visits and  Controlled Substance Database  18 year old female presents to the ED with complaint of low back pain and dysuria.  Patient states that she was treated for UTI 1 month ago but did not complete the antibiotics.  Urine pregnant was negative.  Urinalysis consistent with a UTI.  Patient was strongly urged to complete the entire course of antibiotics.  She was given the first dose in the ED along with a Pyridium for discomfort.  A prescription for the remaining 10-day course of antibiotics was sent to her pharmacy.  ____________________________________________   FINAL CLINICAL IMPRESSION(S) / ED DIAGNOSES  Final diagnoses:  Acute urinary tract infection     ED Discharge Orders         Ordered    sulfamethoxazole-trimethoprim (BACTRIM DS) 800-160 MG tablet  2 times daily     06/11/19 2033    phenazopyridine (PYRIDIUM) 100 MG tablet  3 times daily PRN      06/11/19 2033           Note:  This document was prepared using Dragon voice recognition software and may include unintentional dictation errors.    Johnn Hai, PA-C 06/11/19 2041    Blake Divine, MD 06/12/19 [phone]

## 2019-06-11 NOTE — ED Notes (Signed)
URINE PREGNANCY TEST IS NEGATIVE, will not download in computer

## 2019-07-20 ENCOUNTER — Other Ambulatory Visit: Payer: Self-pay

## 2019-07-20 ENCOUNTER — Emergency Department
Admission: EM | Admit: 2019-07-20 | Discharge: 2019-07-20 | Disposition: A | Discharge status: Home / Self Care | Payer: Medicaid Other | Attending: Emergency Medicine | Admitting: Emergency Medicine

## 2019-07-20 DIAGNOSIS — Z79899 Other long term (current) drug therapy: Secondary | ICD-10-CM | POA: Insufficient documentation

## 2019-07-20 DIAGNOSIS — Z7722 Contact with and (suspected) exposure to environmental tobacco smoke (acute) (chronic): Secondary | ICD-10-CM | POA: Insufficient documentation

## 2019-07-20 DIAGNOSIS — M545 Low back pain, unspecified: Secondary | ICD-10-CM

## 2019-07-20 DIAGNOSIS — J45909 Unspecified asthma, uncomplicated: Secondary | ICD-10-CM | POA: Insufficient documentation

## 2019-07-20 LAB — URINALYSIS, COMPLETE (UACMP) WITH MICROSCOPIC
Bilirubin Urine: NEGATIVE
Glucose, UA: NEGATIVE mg/dL
Hgb urine dipstick: NEGATIVE
Ketones, ur: NEGATIVE mg/dL
Leukocytes,Ua: NEGATIVE
Nitrite: NEGATIVE
Protein, ur: NEGATIVE mg/dL
Specific Gravity, Urine: 1.025 (ref 1.005–1.030)
pH: 5 (ref 5.0–8.0)

## 2019-07-20 LAB — PREGNANCY, URINE: Preg Test, Ur: NEGATIVE

## 2019-07-20 MED ORDER — CYCLOBENZAPRINE HCL 5 MG PO TABS: ORAL_TABLET | ORAL | 0 refills | Status: DC

## 2019-07-20 MED ORDER — IBUPROFEN 400 MG PO TABS: 400.0000 mg | ORAL_TABLET | Freq: Four times a day (QID) | ORAL | 0 refills | Status: DC | PRN

## 2019-07-20 MED ORDER — KETOROLAC TROMETHAMINE 30 MG/ML IJ SOLN
30.0000 mg | Freq: Once | INTRAMUSCULAR | Status: AC
  Administered 2019-07-20: 30 mg via INTRAMUSCULAR
  Filled 2019-07-20: qty 1

## 2019-07-20 NOTE — ED Notes (Signed)
POC not completed, not enough urine on POCT to result. New orders placed for preg urine to be completed in lab with urine specimen that was already sent.

## 2019-07-20 NOTE — ED Notes (Signed)
Pt ambulatory to treatment room. Pt walking as though she is trying to keep her back stiff but is having no difficulty walking. No numbness reported.

## 2019-07-20 NOTE — ED Triage Notes (Signed)
Pt in with co low back pain for few days states entire lower back. Does lift people at work. Worse when she moves or stands.

## 2019-07-20 NOTE — ED Notes (Signed)
Pt attempting a urine sample at this time.

## 2019-07-20 NOTE — ED Provider Notes (Signed)
[facility] Emergency Department Provider Note  ____________________________________________  Time seen: Approximately 9:06 PM  I have reviewed the triage vital signs and the nursing notes.   HISTORY  Chief Complaint Back Pain    HPI Destiny [name] is a 18 y.o. female presents to the emergency department for evaluation of low back pain for 5 days.  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  It feels like a pulled muscle.  Pain is worse with certain movements.  Pain does not radiate.  Patient has had several UTIs this year and this does not feel similar.  No urinary symptoms.  No trauma.  No fever, nausea, vomiting, abdominal pain, dysuria, urgency, frequency.   Past Medical History:  Diagnosis Date  . Asthma   . Insomnia   . Ovarian cyst     Patient Active Problem List   Diagnosis Date Noted  . Overdose 11/02/2018  . 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 At high risk for self harm 11/01/2018  . MDD (major depressive disorder), recurrent severe, without psychosis (HCC) 11/01/2018  . Moderate major depression, single episode (HCC) 09/22/2018  . Alcohol abuse 09/22/2018  . Substance induced mood disorder (HCC) 09/22/2018    Past Surgical History:  Procedure Laterality Date  . TONSILLECTOMY      Prior to Admission medications   Medication Sig Start Date End Date Taking? Authorizing Provider  etonogestrel (NEXPLANON) 68 MG IMPL implant 1 each by Subdermal route once.    [provider]  hydrOXYzine (ATARAX/VISTARIL) 25 MG tablet Take 1 tablet (25 mg total) by mouth 3 (three) times daily as needed for anxiety. 11/02/18   Pucilowska, Braulio ConteJolanta B, MD  phenazopyridine (PYRIDIUM) 100 MG tablet Take 1 tablet (100 mg total) by mouth 3 (three) times daily as needed for pain. 06/11/19 06/10/20  Tommi RumpsSummers, Rhonda L, PA-C  sertraline (ZOLOFT) 50 MG tablet Take 1 tablet (50 mg total) by mouth daily.  11/02/18   Pucilowska, Jolanta B, MD  sulfamethoxazole-trimethoprim (BACTRIM DS) 800-160 MG tablet Take 1 tablet by mouth 2 (two) times daily. 06/11/19   Tommi RumpsSummers, Rhonda L, PA-C    Allergies Lorazepam, Zofran Frazier Richards[ondansetron hcl], and Zolpidem  No family history on file.  Social History Social History   Tobacco Use  . Smoking status: Passive Smoke Exposure - Never Smoker  . Smokeless tobacco: Never Used  Substance Use Topics  . Alcohol use: No  . Drug use: No     Review of Systems  Constitutional: No fever/chills Respiratory: No SOB. Gastrointestinal: No abdominal pain.  No nausea, no vomiting.  Genitourinary: Negative for dysuria. Musculoskeletal: Positive for back pain. Skin: Negative for rash, abrasions, lacerations, ecchymosis. Neurological: Negative for numbness or tingling   ____________________________________________   PHYSICAL EXAM:  VITAL SIGNS: ED Triage Vitals [07/20/19 1907]  Enc Vitals Group     BP (!) 136/101     Pulse Rate (!) 101     Resp 20     Temp 98.7 F (37.1 C)     Temp Source Oral     SpO2      Weight 183 lb (83 kg)     Height 5\' 5"  (1.651 m)     Head Circumference      Peak Flow      Pain Score 5     Pain Loc      Pain Edu?      Excl. in [place]?      Constitutional: Alert and  oriented. Well appearing and in no acute distress. Eyes: Conjunctivae are normal. PERRL. EOMI. Head: Atraumatic. ENT:      Ears:      Nose: No congestion/rhinnorhea.      Mouth/Throat: Mucous membranes are moist.  Neck: No stridor. Cardiovascular: Normal rate, regular rhythm.  Good peripheral circulation. Respiratory: Normal respiratory effort without tachypnea or retractions. Lungs CTAB. Good air entry to the bases with no decreased or absent breath sounds. Gastrointestinal: Bowel sounds 4 quadrants. Soft and nontender to palpation. No guarding or rigidity. No palpable masses. No distention.  Musculoskeletal: Full range of motion to all extremities. No gross  deformities appreciated.  Mild tenderness to palpation throughout low back.  Strength equal in lower extremities bilaterally.  Normal gait. Neurologic:  Normal speech and language. No gross focal neurologic deficits are appreciated.  Skin:  Skin is warm, dry and intact. No rash noted. Psychiatric: Mood and affect are normal. Speech and behavior are normal. Patient exhibits appropriate insight and judgement.   ____________________________________________   LABS (all labs ordered are listed, but only abnormal results are displayed)  Labs Reviewed  URINALYSIS, COMPLETE (UACMP) WITH MICROSCOPIC - Abnormal; Notable for the following components:      Result Value   Color, Urine YELLOW (*)    APPearance HAZY (*)    Bacteria, UA RARE (*)    All other components within normal limits  URINE CULTURE  PREGNANCY, URINE  POC URINE PREG, ED   ____________________________________________  EKG   ____________________________________________  RADIOLOGY   No results found.  ____________________________________________    PROCEDURES  Procedure(s) performed:    Procedures    Medications  ketorolac (TORADOL) 30 MG/ML injection 30 mg (has no administration in time range)     ____________________________________________   INITIAL IMPRESSION / ASSESSMENT AND PLAN / ED COURSE  Pertinent labs & imaging results that were available during my care of the patient were reviewed by me and considered in my medical decision making (see chart for details).  Review of the [place] CSRS was performed in accordance of the [place] prior to dispensing any controlled drugs.   Patient presented to the emergency department for evaluation of low back pain.  Vital signs and exam are reassuring.  Urinalysis shows rare bacteria and will be sent for culture.  Patient denies any urinary symptoms.  Patient was given IM Toradol for pain and inflammation.  Patient will be discharged home with prescriptions for  Flexeril and Motrin. Patient is to follow up with primary care as directed. Patient is given ED precautions to return to the ED for any worsening or new symptoms.  Destiny Anne Ng [name] was evaluated in Emergency Department on 07/20/2019 for the symptoms described in the history of present illness. She was evaluated in the context of the global COVID-19 pandemic, which necessitated consideration that the patient might be at risk for infection with the SARS-CoV-2 virus that causes COVID-19. Institutional protocols and algorithms that pertain to the evaluation of patients at risk for COVID-19 are in a state of rapid change based on information released by regulatory bodies including the CDC and federal and state organizations. These policies and algorithms were followed during the patient's care in the ED.   ____________________________________________  FINAL CLINICAL IMPRESSION(S) / ED DIAGNOSES  Final diagnoses:  None      NEW MEDICATIONS STARTED DURING THIS VISIT:  ED Discharge Orders    None          This chart was dictated using voice recognition software/Dragon. Despite  best efforts to proofread, errors can occur which can change the meaning. Any change was purely unintentional.    Enid Derry, PA-C 07/20/19 2219    Dionne Bucy, MD 07/20/19 2232

## 2019-07-20 NOTE — Discharge Instructions (Signed)
Do not take muscle relaxer before work.

## 2019-07-22 LAB — URINE CULTURE: Culture: NO GROWTH

## 2019-08-01 ENCOUNTER — Other Ambulatory Visit: Payer: Self-pay | Admitting: Emergency Medicine

## 2019-08-02 ENCOUNTER — Telehealth: Payer: Medicaid Other | Admitting: Physician Assistant

## 2019-08-02 DIAGNOSIS — L709 Acne, unspecified: Secondary | ICD-10-CM

## 2019-08-02 MED ORDER — BENZOYL PEROXIDE-ERYTHROMYCIN 5-3 % EX GEL: Freq: Two times a day (BID) | CUTANEOUS | 0 refills | Status: DC

## 2019-08-02 NOTE — Progress Notes (Signed)
We are sorry that you are experiencing this issue.  Here is how we plan to help!  Based on what you shared with me it looks like you have uncomplicated acne.  Acne is a disorder of the hair follicles and oil glands (sebaceous glands). The sebaceous glands secrete oils to keep the skin moist.  When the glands get clogged, it can lead to pimples or cysts.  These cysts may become infected and leave scars. Acne is very common and normally occurs at puberty.  Acne is also inherited.  Your personal care plan consists of the following recommendations:  I recommend that you use a daily cleanser  You may try a topical exfoliator and salicylic acid scrub.  These scrubs have coarse particles that clear your pores but may also irritate your skin.  I have prescribed a topical gel with an antibiotic:  Benzoyl peroxide-erythromycin gel.  This gel should be applied to the affected areas twice a day. Be sure to read the package insert for potential side effects.   If excessive dryness or peeling occurs, reduce dose frequency or concentration of the topical scrubs.  If excessive stinging or burning occurs, remove the topical gel with mild soap and water and resume at a lower dose the next day.  Remember oral antibiotics and topical acne treatments may increase your sensitivity to the sun!  HOME CARE:  Do not squeeze pimples because that can often lead to infections, worse acne, and scars.  Use a moisturizer that contains retinoid or fruit acids that may inhibit the development of new acne lesions.  Although there is not a clear link that foods can cause acne, doctors do believe that too many sweets predispose you to skin problems.  GET HELP RIGHT AWAY IF:  If your acne gets worse or is not better within 10 days.  If you become depressed.  If you become pregnant, discontinue medications and call your OB/GYN.  MAKE SURE YOU:  Understand these instructions.  Will watch your condition.  Will get  help right away if you are not doing well or get worse.   Your e-visit answers were reviewed by a board certified advanced clinical practitioner to complete your personal care plan.  Depending upon the condition, your plan could have included both over the counter or prescription medications.  Please review your pharmacy choice.  If there is a problem, you may contact your provider through [employer] and have the prescription routed to another pharmacy.  Your safety is important to [place].  If you have drug allergies check your prescription carefully.  For the next 24 hours you can use MyChart to ask questions about today's visit, request a non-urgent call back, or ask for a work or school excuse from your e-visit provider.  You will get an email in the next two days asking about your experience. I hope that your e-visit has been valuable and will speed your recovery.  Greater than 5 minutes, yet less than 10 minutes of time have been spent researching, coordinating and implementing care for this patient today.

## 2019-08-16 ENCOUNTER — Other Ambulatory Visit: Payer: Self-pay

## 2019-08-16 DIAGNOSIS — Z20822 Contact with and (suspected) exposure to covid-19: Secondary | ICD-10-CM

## 2019-08-17 LAB — NOVEL CORONAVIRUS, NAA: SARS-CoV-2, NAA: NOT DETECTED

## 2019-10-13 ENCOUNTER — Other Ambulatory Visit: Payer: Self-pay

## 2019-10-13 ENCOUNTER — Emergency Department
Admission: EM | Admit: 2019-10-13 | Discharge: 2019-10-13 | Disposition: A | Discharge status: Home / Self Care | Payer: Medicaid Other | Attending: Student in an Organized Health Care Education/Training Program | Admitting: Student in an Organized Health Care Education/Training Program

## 2019-10-13 DIAGNOSIS — Z7722 Contact with and (suspected) exposure to environmental tobacco smoke (acute) (chronic): Secondary | ICD-10-CM | POA: Insufficient documentation

## 2019-10-13 DIAGNOSIS — G4700 Insomnia, unspecified: Secondary | ICD-10-CM

## 2019-10-13 DIAGNOSIS — Z79899 Other long term (current) drug therapy: Secondary | ICD-10-CM | POA: Insufficient documentation

## 2019-10-13 DIAGNOSIS — F1210 Cannabis abuse, uncomplicated: Secondary | ICD-10-CM | POA: Insufficient documentation

## 2019-10-13 DIAGNOSIS — J45909 Unspecified asthma, uncomplicated: Secondary | ICD-10-CM | POA: Insufficient documentation

## 2019-10-13 DIAGNOSIS — F419 Anxiety disorder, unspecified: Secondary | ICD-10-CM

## 2019-10-13 LAB — CBC
HCT: 45.5 % (ref 36.0–46.0)
Hemoglobin: 15.3 g/dL — ABNORMAL HIGH (ref 12.0–15.0)
MCH: 29.1 pg (ref 26.0–34.0)
MCHC: 33.6 g/dL (ref 30.0–36.0)
MCV: 86.7 fL (ref 80.0–100.0)
Platelets: 369 10*3/uL (ref 150–400)
RBC: 5.25 MIL/uL — ABNORMAL HIGH (ref 3.87–5.11)
RDW: 12.5 % (ref 11.5–15.5)
WBC: 13.4 10*3/uL — ABNORMAL HIGH (ref 4.0–10.5)
nRBC: 0 % (ref 0.0–0.2)

## 2019-10-13 LAB — URINE DRUG SCREEN, QUALITATIVE (ARMC ONLY)
Amphetamines, Ur Screen: NOT DETECTED
Barbiturates, Ur Screen: NOT DETECTED
Benzodiazepine, Ur Scrn: NOT DETECTED
Cannabinoid 50 Ng, Ur ~~LOC~~: POSITIVE — AB
Cocaine Metabolite,Ur ~~LOC~~: NOT DETECTED
MDMA (Ecstasy)Ur Screen: NOT DETECTED
Methadone Scn, Ur: NOT DETECTED
Opiate, Ur Screen: NOT DETECTED
Phencyclidine (PCP) Ur S: NOT DETECTED
Tricyclic, Ur Screen: NOT DETECTED

## 2019-10-13 LAB — COMPREHENSIVE METABOLIC PANEL
ALT: 14 U/L (ref 0–44)
AST: 19 U/L (ref 15–41)
Albumin: 4.8 g/dL (ref 3.5–5.0)
Alkaline Phosphatase: 118 U/L (ref 38–126)
Anion gap: 10 (ref 5–15)
BUN: 10 mg/dL (ref 6–20)
CO2: 25 mmol/L (ref 22–32)
Calcium: 9.6 mg/dL (ref 8.9–10.3)
Chloride: 106 mmol/L (ref 98–111)
Creatinine, Ser: 0.77 mg/dL (ref 0.44–1.00)
GFR calc Af Amer: >60 mL/min (ref >60)
GFR calc non Af Amer: >60 mL/min (ref >60)
Glucose, Bld: 96 mg/dL (ref 70–99)
Potassium: 3.5 mmol/L (ref 3.5–5.1)
Sodium: 141 mmol/L (ref 135–145)
Total Bilirubin: 0.8 mg/dL (ref 0.3–1.2)
Total Protein: 8.4 g/dL — ABNORMAL HIGH (ref 6.5–8.1)

## 2019-10-13 LAB — POCT PREGNANCY, URINE: Preg Test, Ur: NEGATIVE

## 2019-10-13 LAB — ACETAMINOPHEN LEVEL: Acetaminophen (Tylenol), Serum: <10 ug/mL — ABNORMAL LOW (ref 10–30)

## 2019-10-13 LAB — ETHANOL: Alcohol, Ethyl (B): <10 mg/dL (ref <10)

## 2019-10-13 LAB — SALICYLATE LEVEL: Salicylate Lvl: <7 mg/dL — ABNORMAL LOW (ref 7.0–30.0)

## 2019-10-13 LAB — PREGNANCY, URINE: Preg Test, Ur: NEGATIVE

## 2019-10-13 MED ORDER — LORAZEPAM 0.5 MG PO TABS: 0.5000 mg | ORAL_TABLET | Freq: Every day | ORAL | 0 refills | Status: DC

## 2019-10-13 NOTE — ED Triage Notes (Signed)
Pt c/o anxiety. Reports having medication for it but states she has not been taking it because it makes her sleep too long. Pt reports not being able to sleep over the last week, and increased anxiety, also reports when she is able to fall asleep she oversleeps for work. Pt states she has felt "out of it" and she cant remember stuff. Also states she can have conversation with someone and not remember it. NAD noted in triage.

## 2019-10-13 NOTE — ED Notes (Signed)
AAOx3.  Skin warm and dry. NAD.  Calm and cooperative.

## 2019-10-13 NOTE — ED Provider Notes (Signed)
[name] Emergency Department Provider Note    First MD Initiated Contact with Patient 10/13/19 1407     (approximate)  I have reviewed the triage vital signs and the nursing notes.   HISTORY  Chief Complaint Anxiety and Psychiatric Evaluation    HPI Destiny [name] is a 19 y.o. female with the below listed past medical history presents to the ER 1 week of feeling anxious and having trouble sleeping.  Says that she works third shift.  She has been trying her prescribed Vistaril at home but does not feel like that is helping.  Denies any SI or HI.  No recent medication changes.  States that she has not followed up with her PCP or [facility].  Denies any hallucinations.  Past Medical History:  Diagnosis Date  . Asthma   . Insomnia   . Ovarian cyst    History reviewed. No pertinent family history. Past Surgical History:  Procedure Laterality Date  . TONSILLECTOMY     Patient Active Problem List   Diagnosis Date Noted  . Overdose 11/02/2018  . 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 At high risk for self harm 11/01/2018  . MDD (major depressive disorder), recurrent severe, without psychosis (HCC) 11/01/2018  . Moderate major depression, single episode (HCC) 09/22/2018  . Alcohol abuse 09/22/2018  . Substance induced mood disorder (HCC) 09/22/2018      Prior to Admission medications   Medication Sig Start Date End Date Taking? Authorizing Provider  benzoyl peroxide-erythromycin (BENZAMYCIN) gel Apply topically 2 (two) times daily. 08/02/19   McVey, Madelaine Bhat, PA-C  cyclobenzaprine (FLEXERIL) 5 MG tablet Take 1-2 tablets 3 times daily as needed 07/20/19   Enid Derry, PA-C  etonogestrel (NEXPLANON) 68 MG IMPL implant 1 each by Subdermal route once.    [provider]  hydrOXYzine (ATARAX/VISTARIL) 25 MG tablet Take 1 tablet (25 mg total) by mouth 3 (three) times daily as needed for  anxiety. 11/02/18   Pucilowska, Braulio Conte B, MD  ibuprofen (ADVIL) 400 MG tablet Take 1 tablet (400 mg total) by mouth every 6 (six) hours as needed. 07/20/19   Enid Derry, PA-C  LORazepam (ATIVAN) 0.5 MG tablet Take 1 tablet (0.5 mg total) by mouth at bedtime. 10/13/19 10/12/20  Willy Eddy, MD  phenazopyridine (PYRIDIUM) 100 MG tablet Take 1 tablet (100 mg total) by mouth 3 (three) times daily as needed for pain. 06/11/19 06/10/20  Tommi Rumps, PA-C  sertraline (ZOLOFT) 50 MG tablet Take 1 tablet (50 mg total) by mouth daily. 11/02/18   Pucilowska, Jolanta B, MD  sulfamethoxazole-trimethoprim (BACTRIM DS) 800-160 MG tablet Take 1 tablet by mouth 2 (two) times daily. 06/11/19   Tommi Rumps, PA-C    Allergies Lorazepam, Zofran Frazier Richards hcl], and Zolpidem    Social History Social History   Tobacco Use  . Smoking status: Passive Smoke Exposure - Never Smoker  . Smokeless tobacco: Never Used  Substance Use Topics  . Alcohol use: No  . Drug use: Yes    Types: Marijuana    Review of Systems Patient denies headaches, rhinorrhea, blurry vision, numbness, shortness of breath, chest pain, edema, cough, abdominal pain, nausea, vomiting, diarrhea, dysuria, fevers, rashes or hallucinations unless otherwise stated above in HPI. ____________________________________________   PHYSICAL EXAM:  VITAL SIGNS: Vitals:   10/13/19 1319  BP: (!) 104/52  Pulse: 91  Resp: 18  Temp: 98.4 F (36.9 C)  SpO2: 99%    Constitutional: Alert  and oriented. Well appearing and in no acute distress. Eyes: Conjunctivae are normal.  Head: Atraumatic. Nose: No congestion/rhinnorhea. Mouth/Throat: Mucous membranes are moist.   Neck: Painless ROM.  Cardiovascular:   Good peripheral circulation. Respiratory: Normal respiratory effort.  No retractions.  Gastrointestinal: Soft and nontender.  Musculoskeletal: No lower extremity tenderness .  No joint effusions. Neurologic:  Normal speech and  language. No gross focal neurologic deficits are appreciated.  Skin:  Skin is warm, dry and intact. No rash noted. Psychiatric: Mood and affect are normal. Speech and behavior are normal.  ____________________________________________   LABS (all labs ordered are listed, but only abnormal results are displayed)  Results for orders placed or performed during the hospital encounter of 10/13/19 (from the past 24 hour(s))  Urine Drug Screen, Qualitative     Status: Abnormal   Collection Time: 10/13/19  1:22 PM  Result Value Ref Range   Tricyclic, Ur Screen NONE DETECTED NONE DETECTED   Amphetamines, Ur Screen NONE DETECTED NONE DETECTED   MDMA (Ecstasy)Ur Screen NONE DETECTED NONE DETECTED   Cocaine Metabolite,Ur  [place] NONE DETECTED NONE DETECTED   Opiate, Ur Screen NONE DETECTED NONE DETECTED   Phencyclidine (PCP) Ur S NONE DETECTED NONE DETECTED   Cannabinoid 50 Ng, Ur [place] POSITIVE (A) NONE DETECTED   Barbiturates, Ur Screen NONE DETECTED NONE DETECTED   Benzodiazepine, Ur Scrn NONE DETECTED NONE DETECTED   Methadone Scn, Ur NONE DETECTED NONE DETECTED  Comprehensive metabolic panel     Status: Abnormal   Collection Time: 10/13/19  1:42 PM  Result Value Ref Range   Sodium 141 135 - 145 mmol/L   Potassium 3.5 3.5 - 5.1 mmol/L   Chloride 106 98 - 111 mmol/L   CO2 25 22 - 32 mmol/L   Glucose, Bld 96 70 - 99 mg/dL   BUN 10 6 - 20 mg/dL   Creatinine, Ser 8.92 0.44 - 1.00 mg/dL   Calcium 9.6 8.9 - 11.9 mg/dL   Total Protein 8.4 (H) 6.5 - 8.1 g/dL   Albumin 4.8 3.5 - 5.0 g/dL   AST 19 15 - 41 U/L   ALT 14 0 - 44 U/L   Alkaline Phosphatase 118 38 - 126 U/L   Total Bilirubin 0.8 0.3 - 1.2 mg/dL   GFR calc non Af Amer >60 >60 mL/min   GFR calc Af Amer >60 >60 mL/min   Anion gap 10 5 - 15  Ethanol     Status: None   Collection Time: 10/13/19  1:42 PM  Result Value Ref Range   Alcohol, Ethyl (B) <10 <10 mg/dL  Salicylate level     Status: Abnormal   Collection Time: 10/13/19  1:42 PM    Result Value Ref Range   Salicylate Lvl <7.0 (L) 7.0 - 30.0 mg/dL  Acetaminophen level     Status: Abnormal   Collection Time: 10/13/19  1:42 PM  Result Value Ref Range   Acetaminophen (Tylenol), Serum <10 (L) 10 - 30 ug/mL  cbc     Status: Abnormal   Collection Time: 10/13/19  1:42 PM  Result Value Ref Range   WBC 13.4 (H) 4.0 - 10.5 K/uL   RBC 5.25 (H) 3.87 - 5.11 MIL/uL   Hemoglobin 15.3 (H) 12.0 - 15.0 g/dL   HCT 41.7 40.8 - 14.4 %   MCV 86.7 80.0 - 100.0 fL   MCH 29.1 26.0 - 34.0 pg   MCHC 33.6 30.0 - 36.0 g/dL   RDW 81.8 56.3 - 14.9 %  Platelets 369 150 - 400 K/uL   nRBC 0.0 0.0 - 0.2 %  Pregnancy, urine POC     Status: None   Collection Time: 10/13/19  2:17 PM  Result Value Ref Range   Preg Test, Ur NEGATIVE NEGATIVE   ____________________________________________ ____________________________________________   PROCEDURES  Procedure(s) performed:  Procedures    Critical Care performed: no ____________________________________________   INITIAL IMPRESSION / ASSESSMENT AND PLAN / ED COURSE  Pertinent labs & imaging results that were available during my care of the patient were reviewed by me and considered in my medical decision making (see chart for details).  DDX: Psychosis, delirium, medication effect, noncompliance, polysubstance abuse, Si, Hi, depression   Markesha Anne Ng [name] is a 19 y.o. who presents to the ED with feelings of anxiety and difficulty sleeping.  Patient well-appearing no SI or HI.  No indication for hospitalization.  Appropriate for outpatient referral.  Have discussed with the patient and available family all diagnostics and treatments performed thus far and all questions were answered to the best of my ability. The patient demonstrates understanding and agreement with plan.  The patient was evaluated in Emergency Department today for the symptoms described in the history of present illness. He/she was evaluated in the context of the global  COVID-19 pandemic, which necessitated consideration that the patient might be at risk for infection with the SARS-CoV-2 virus that causes COVID-19. Institutional protocols and algorithms that pertain to the evaluation of patients at risk for COVID-19 are in a state of rapid change based on information released by regulatory bodies including the CDC and federal and state organizations. These policies and algorithms were followed during the patient's care in the ED.       ____________________________________________   FINAL CLINICAL IMPRESSION(S) / ED DIAGNOSES  Final diagnoses:  Anxiety  Insomnia, unspecified type      NEW MEDICATIONS STARTED DURING THIS VISIT:  New Prescriptions   LORAZEPAM (ATIVAN) 0.5 MG TABLET    Take 1 tablet (0.5 mg total) by mouth at bedtime.     Note:  This document was prepared using Dragon voice recognition software and may include unintentional dictation errors.     Merlyn Lot, MD 10/13/19 1435

## 2019-10-13 NOTE — ED Notes (Signed)
Arrives with c/o worsening anxiety recently.  Denies SI/ HI, but states she has been having difficulty focusing 'for a while'.  Patient has not been taking medications 'for a while' due to inability to afford them and the Atarax makes her sleepy and she can't work when taking it.  Per medication history, patient had been taking Zoloft and Atarax.  AAOx3.  Skin warm and dry.  Calm and cooperative.  NAD

## 2019-10-24 ENCOUNTER — Other Ambulatory Visit: Payer: Medicaid Other

## 2019-11-02 ENCOUNTER — Emergency Department: Payer: Self-pay

## 2019-11-02 ENCOUNTER — Other Ambulatory Visit: Payer: Self-pay

## 2019-11-02 ENCOUNTER — Emergency Department
Admission: EM | Admit: 2019-11-02 | Discharge: 2019-11-02 | Disposition: A | Discharge status: Home / Self Care | Payer: Self-pay | Attending: Emergency Medicine | Admitting: Emergency Medicine

## 2019-11-02 DIAGNOSIS — R42 Dizziness and giddiness: Secondary | ICD-10-CM | POA: Insufficient documentation

## 2019-11-02 DIAGNOSIS — J45909 Unspecified asthma, uncomplicated: Secondary | ICD-10-CM | POA: Insufficient documentation

## 2019-11-02 DIAGNOSIS — Z7722 Contact with and (suspected) exposure to environmental tobacco smoke (acute) (chronic): Secondary | ICD-10-CM | POA: Insufficient documentation

## 2019-11-02 DIAGNOSIS — R519 Headache, unspecified: Secondary | ICD-10-CM | POA: Insufficient documentation

## 2019-11-02 MED ORDER — PROMETHAZINE HCL 25 MG PO TABS
12.5000 mg | ORAL_TABLET | Freq: Once | ORAL | Status: AC
  Administered 2019-11-02: 12.5 mg via ORAL
  Filled 2019-11-02: qty 1

## 2019-11-02 MED ORDER — KETOROLAC TROMETHAMINE 30 MG/ML IJ SOLN
30.0000 mg | Freq: Once | INTRAMUSCULAR | Status: AC
  Administered 2019-11-02: 30 mg via INTRAMUSCULAR
  Filled 2019-11-02: qty 1

## 2019-11-02 MED ORDER — IBUPROFEN 600 MG PO TABS: 600.0000 mg | ORAL_TABLET | Freq: Four times a day (QID) | ORAL | 0 refills | Status: DC | PRN

## 2019-11-02 NOTE — ED Triage Notes (Signed)
Pt to the er for headache x 4 days. Pt taking IBU at home.

## 2019-11-02 NOTE — ED Provider Notes (Addendum)
[facility] Emergency Department Provider Note  ____________________________________________  Time seen: Approximately 10:28 PM  I have reviewed the triage vital signs and the nursing notes.   HISTORY  Chief Complaint Headache    HPI Destiny [name] is a 19 y.o. female that presents to the emergency department for evaluation of headache for 4 days.  Patient states that she has no history of headaches.  Headache wraps around her head.  It worsened 4 hours ago to the front of her head. She has never had a headache like this.    She has had some dizziness.  She has been taking ibuprofen without relief.  No trauma.  No fevers, visual changes.   Past Medical History:  Diagnosis Date  . Asthma   . Insomnia   . Ovarian cyst     Patient Active Problem List   Diagnosis Date Noted  . Overdose 11/02/2018  . MDD (major depressive disorder), recurrent, severe, with psychosis ([place]) 11/01/2018  . At high risk for self harm 11/01/2018  . MDD (major depressive disorder), recurrent severe, without psychosis ([place]) 11/01/2018  . Moderate major depression, single episode ([place]) 09/22/2018  . Alcohol abuse 09/22/2018  . Substance induced mood disorder ([place]) 09/22/2018    Past Surgical History:  Procedure Laterality Date  . TONSILLECTOMY      Prior to Admission medications   Medication Sig Start Date End Date Taking? Authorizing Provider  benzoyl peroxide-erythromycin (BENZAMYCIN) gel Apply topically 2 (two) times daily. 08/02/19   McVey, Gelene Mink, PA-C  cyclobenzaprine (FLEXERIL) 5 MG tablet Take 1-2 tablets 3 times daily as needed 07/20/19   Laban Emperor, PA-C  etonogestrel (NEXPLANON) 68 MG IMPL implant 1 each by Subdermal route once.    [provider]  hydrOXYzine (ATARAX/VISTARIL) 25 MG tablet Take 1 tablet (25 mg total) by mouth 3 (three) times daily as needed for anxiety. 11/02/18   Pucilowska, Herma Ard B, MD  ibuprofen (ADVIL) 600 MG  tablet Take 1 tablet (600 mg total) by mouth every 6 (six) hours as needed. 11/02/19   Laban Emperor, PA-C  phenazopyridine (PYRIDIUM) 100 MG tablet Take 1 tablet (100 mg total) by mouth 3 (three) times daily as needed for pain. 06/11/19 06/10/20  Johnn Hai, PA-C  sertraline (ZOLOFT) 50 MG tablet Take 1 tablet (50 mg total) by mouth daily. 11/02/18   Pucilowska, Jolanta B, MD  sulfamethoxazole-trimethoprim (BACTRIM DS) 800-160 MG tablet Take 1 tablet by mouth 2 (two) times daily. 06/11/19   Johnn Hai, PA-C    Allergies Lorazepam, Zofran Alvis Lemmings hcl], and Zolpidem  No family history on file.  Social History Social History   Tobacco Use  . Smoking status: Passive Smoke Exposure - Never Smoker  . Smokeless tobacco: Never Used  Substance Use Topics  . Alcohol use: No  . Drug use: Yes    Types: Marijuana     Review of Systems  Constitutional: No fever/chills Respiratory: No SOB. Gastrointestinal: No abdominal pain.  No nausea, no vomiting. Musculoskeletal: Negative for musculoskeletal pain. Skin: Negative for rash, abrasions, lacerations, ecchymosis. Neurological: Negative for numbness or tingling.  Positive for headache.   ____________________________________________   PHYSICAL EXAM:  VITAL SIGNS: ED Triage Vitals  Enc Vitals Group     BP 11/02/19 1846 126/82     Pulse Rate 11/02/19 1846 (!) 107     Resp 11/02/19 1846 18     Temp 11/02/19 1846 98 F (36.7 C)     Temp Source 11/02/19 1846 Oral  SpO2 11/02/19 1846 98 %     Weight 11/02/19 1847 180 lb (81.6 kg)     Height 11/02/19 1847 5\' 5"  (1.651 m)     Head Circumference --      Peak Flow --      Pain Score 11/02/19 0225 5     Pain Loc --      Pain Edu? --      Excl. in GC? --      Constitutional: Alert and oriented. Well appearing and in no acute distress. Eyes: Conjunctivae are normal. PERRL. EOMI. Head: Atraumatic. ENT:      Ears:      Nose: No congestion/rhinnorhea.       Mouth/Throat: Mucous membranes are moist.  Neck: No stridor. Cardiovascular: Normal rate, regular rhythm.  Good peripheral circulation. Respiratory: Normal respiratory effort without tachypnea or retractions. Lungs CTAB. Good air entry to the bases with no decreased or absent breath sounds. Musculoskeletal: Full range of motion to all extremities. No gross deformities appreciated. Neurologic:  Normal speech and language. No gross focal neurologic deficits are appreciated.  Skin:  Skin is warm, dry and intact. No rash noted. Psychiatric: Mood and affect are normal. Speech and behavior are normal. Patient exhibits appropriate insight and judgement.   ____________________________________________   LABS (all labs ordered are listed, but only abnormal results are displayed)  Labs Reviewed - No data to display ____________________________________________  EKG   ____________________________________________  RADIOLOGY 12/31/19, personally viewed and evaluated these images (plain radiographs) as part of my medical decision making, as well as reviewing the written report by the radiologist.  CT Head Wo Contrast  Result Date: 11/02/2019 CLINICAL DATA:  Headache for 4 days. EXAM: CT HEAD WITHOUT CONTRAST TECHNIQUE: Contiguous axial images were obtained from the base of the skull through the vertex without intravenous contrast. COMPARISON:  None. FINDINGS: Brain: There is no evidence of acute infarct, intracranial hemorrhage, mass, midline shift, or extra-axial fluid collection. The ventricles and sulci are normal. Vascular: No hyperdense vessel. Skull: No fracture or suspicious osseous lesion. Sinuses/Orbits: Visualized paranasal sinuses and mastoid air cells are clear. Visualized orbits are unremarkable. Other: None. IMPRESSION: Negative head CT. Electronically Signed   By: 12/31/2019 M.D.   On: 11/02/2019 21:14     ____________________________________________    PROCEDURES  Procedure(s) performed:    Procedures    Medications  ketorolac (TORADOL) 30 MG/ML injection 30 mg (30 mg Intramuscular Given 11/02/19 2128)  promethazine (PHENERGAN) tablet 12.5 mg (12.5 mg Oral Given 11/02/19 2127)     ____________________________________________   INITIAL IMPRESSION / ASSESSMENT AND PLAN / ED COURSE  Pertinent labs & imaging results that were available during my care of the patient were reviewed by me and considered in my medical decision making (see chart for details).  Review of the [place] CSRS was performed in accordance of the [facility] prior to dispensing any controlled drugs.   Patient's diagnosis is consistent with headache.  CT scan negative for acute abnormalities.  Headache resolved with IM Toradol and oral Phenergan.  Patient will be discharged home with prescriptions for ibuprofen. Patient is to follow up with primary care as directed. Patient is given ED precautions to return to the ED for any worsening or new symptoms.   [address] was evaluated in Emergency Department on 11/02/2019 for the symptoms described in the history of present illness. She was evaluated in the context of the global COVID-19 pandemic, which necessitated consideration that the patient might be  at risk for infection with the SARS-CoV-2 virus that causes COVID-19. Institutional protocols and algorithms that pertain to the evaluation of patients at risk for COVID-19 are in a state of rapid change based on information released by regulatory bodies including the CDC and federal and state organizations. These policies and algorithms were followed during the patient's care in the ED.  ____________________________________________  FINAL CLINICAL IMPRESSION(S) / ED DIAGNOSES  Final diagnoses:  Acute intractable headache, unspecified headache type      NEW MEDICATIONS STARTED DURING THIS VISIT:  ED Discharge  Orders         Ordered    ibuprofen (ADVIL) 600 MG tablet  Every 6 hours PRN     11/02/19 2225              This chart was dictated using voice recognition software/Dragon. Despite best efforts to proofread, errors can occur which can change the meaning. Any change was purely unintentional.    Enid Derry, PA-C 11/02/19 2342    Enid Derry, PA-C 11/02/19 2343    Concha Se, MD 11/05/19 [phone]

## 2019-11-07 ENCOUNTER — Ambulatory Visit: Payer: Medicaid Other | Attending: Internal Medicine

## 2019-11-14 ENCOUNTER — Ambulatory Visit: Payer: Medicaid Other

## 2020-05-29 ENCOUNTER — Other Ambulatory Visit: Payer: Medicaid Other

## 2020-08-21 ENCOUNTER — Other Ambulatory Visit: Payer: Self-pay

## 2020-08-21 ENCOUNTER — Encounter: Payer: Self-pay | Admitting: Emergency Medicine

## 2020-08-21 ENCOUNTER — Emergency Department
Admission: EM | Admit: 2020-08-21 | Discharge: 2020-08-21 | Disposition: A | Discharge status: Home / Self Care | Payer: Medicaid Other | Attending: Emergency Medicine | Admitting: Emergency Medicine

## 2020-08-21 DIAGNOSIS — J45909 Unspecified asthma, uncomplicated: Secondary | ICD-10-CM | POA: Insufficient documentation

## 2020-08-21 DIAGNOSIS — Z7722 Contact with and (suspected) exposure to environmental tobacco smoke (acute) (chronic): Secondary | ICD-10-CM | POA: Diagnosis not present

## 2020-08-21 DIAGNOSIS — T7840XA Allergy, unspecified, initial encounter: Secondary | ICD-10-CM | POA: Diagnosis not present

## 2020-08-21 DIAGNOSIS — R220 Localized swelling, mass and lump, head: Secondary | ICD-10-CM

## 2020-08-21 DIAGNOSIS — K148 Other diseases of tongue: Secondary | ICD-10-CM | POA: Insufficient documentation

## 2020-08-21 DIAGNOSIS — Z79899 Other long term (current) drug therapy: Secondary | ICD-10-CM | POA: Insufficient documentation

## 2020-08-21 MED ORDER — DEXAMETHASONE 10 MG/ML FOR PEDIATRIC ORAL USE
10.0000 mg | Freq: Once | INTRAMUSCULAR | Status: AC
  Administered 2020-08-21: 10 mg via ORAL
  Filled 2020-08-21: qty 1

## 2020-08-21 MED ORDER — DIPHENHYDRAMINE HCL 25 MG PO CAPS
50.0000 mg | ORAL_CAPSULE | Freq: Once | ORAL | Status: AC
  Administered 2020-08-21: 50 mg via ORAL
  Filled 2020-08-21: qty 2

## 2020-08-21 NOTE — ED Notes (Signed)
Pt states tonight at work began having chest discomfort and tongue swelling. No distress noted, pt denies trouble breathing or swallowing .

## 2020-08-21 NOTE — ED Provider Notes (Signed)
[facility] Emergency Department Provider Note  ____________________________________________   First MD Initiated Contact with Patient 08/21/20 [phone]     (approximate)  I have reviewed the triage vital signs and the nursing notes.   HISTORY  Chief Complaint Allergic Reaction    HPI Destiny [name] is a 19 y.o. female who presents for evaluation of acute onset tongue swelling that she believes to be an allergic reaction.  This happened spontaneously without having had anything to eat or drink or coming into contact with anything recently that she thinks might of caused it.  She has had this, thing happened in the past and has EpiPen's at home.  She did not use them this time and came to the ED for evaluation.  She has no new medications recently.  She is already feeling better by the time I saw her and would like to go home.  The symptoms were moderate in severity but scared her and the got better after a dose of Benadryl in the emergency department.  She was having no trouble swallowing or speaking but she said her tongue was quite swollen.  She did not take anything for it at home.  She denies fever, sore throat, nausea, vomiting, abdominal pain, chest pain, and dysuria.         Past Medical History:  Diagnosis Date  . Asthma   . Insomnia   . Ovarian cyst     Patient Active Problem List   Diagnosis Date Noted  . Overdose 11/02/2018  . 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 At high risk for self harm 11/01/2018  . MDD (major depressive disorder), recurrent severe, without psychosis (HCC) 11/01/2018  . Moderate major depression, single episode (HCC) 09/22/2018  . Alcohol abuse 09/22/2018  . Substance induced mood disorder (HCC) 09/22/2018    Past Surgical History:  Procedure Laterality Date  . TONSILLECTOMY      Prior to Admission medications   Medication Sig Start Date End Date Taking?  Authorizing Provider  benzoyl peroxide-erythromycin (BENZAMYCIN) gel Apply topically 2 (two) times daily. 08/02/19   McVey, Madelaine Bhat, PA-C  cyclobenzaprine (FLEXERIL) 5 MG tablet Take 1-2 tablets 3 times daily as needed 07/20/19   Enid Derry, PA-C  etonogestrel (NEXPLANON) 68 MG IMPL implant 1 each by Subdermal route once.    [provider]  hydrOXYzine (ATARAX/VISTARIL) 25 MG tablet Take 1 tablet (25 mg total) by mouth 3 (three) times daily as needed for anxiety. 11/02/18   Pucilowska, Braulio Conte B, MD  ibuprofen (ADVIL) 600 MG tablet Take 1 tablet (600 mg total) by mouth every 6 (six) hours as needed. 11/02/19   Enid Derry, PA-C  sertraline (ZOLOFT) 50 MG tablet Take 1 tablet (50 mg total) by mouth daily. 11/02/18   Pucilowska, Jolanta B, MD  sulfamethoxazole-trimethoprim (BACTRIM DS) 800-160 MG tablet Take 1 tablet by mouth 2 (two) times daily. 06/11/19   Tommi Rumps, PA-C    Allergies Lorazepam, Zofran [ondansetron hcl], and Zolpidem  History reviewed. No pertinent family history.  Social History Social History   Tobacco Use  . Smoking status: Passive Smoke Exposure - Never Smoker  . Smokeless tobacco: Never Used  Vaping Use  . Vaping Use: Every day  Substance Use Topics  . Alcohol use: No  . Drug use: Yes    Types: Marijuana    Review of Systems Constitutional: No fever/chills Eyes: No visual changes. ENT: Tongue swelling. Cardiovascular: Denies chest pain. Respiratory:  Denies shortness of breath. Gastrointestinal: No abdominal pain.  No nausea, no vomiting.  No diarrhea.  No constipation. Genitourinary: Negative for dysuria. Musculoskeletal: Negative for neck pain.  Negative for back pain. Integumentary: Negative for rash. Neurological: Negative for headaches, focal weakness or numbness.   ____________________________________________   PHYSICAL EXAM:  VITAL SIGNS: ED Triage Vitals  Enc Vitals Group     BP 08/21/20 0207 138/81      Pulse Rate 08/21/20 0207 84     Resp 08/21/20 0207 16     Temp 08/21/20 0207 98.8 F (37.1 C)     Temp Source 08/21/20 0207 Oral     SpO2 08/21/20 0207 98 %     Weight 08/21/20 0208 78.5 kg (173 lb)     Height 08/21/20 0208 1.651 m (5\' 5" )     Head Circumference --      Peak Flow --      Pain Score 08/21/20 0208 6     Pain Loc --      Pain Edu? --      Excl. in GC? --     Constitutional: Alert and oriented.  Eyes: Conjunctivae are normal.  Head: Atraumatic. Mouth/Throat: At this point I do not appreciate any evidence of angioedema.  It is possible that her tongue may seem a little bit thicker than normal, but she is speaking clearly and easily and having no trouble swallowing her secretions. Neck: No stridor.  No meningeal signs.  No cervical lymphadenopathy, no brawny induration. Cardiovascular: Normal rate, regular rhythm. Good peripheral circulation. Grossly normal heart sounds. Respiratory: Normal respiratory effort.  No retractions. Neurologic:  Normal speech and language. No gross focal neurologic deficits are appreciated.  Skin:  Skin is warm, dry and intact. Psychiatric: Mood and affect are normal. Speech and behavior are normal.  ____________________________________________   LABS (all labs ordered are listed, but only abnormal results are displayed)  Labs Reviewed - No data to display ____________________________________________  EKG  No indication for emergent EKG ____________________________________________  RADIOLOGY I, 08/23/20, personally viewed and evaluated these images (plain radiographs) as part of my medical decision making, as well as reviewing the written report by the radiologist.  ED MD interpretation: No indication for emergent imaging  Official radiology report(s): No results found.  ____________________________________________   PROCEDURES   Procedure(s) performed (including Critical  Care):  Procedures   ____________________________________________   INITIAL IMPRESSION / MDM / ASSESSMENT AND PLAN / ED COURSE  As part of my medical decision making, I reviewed the following data within the electronic MEDICAL RECORD NUMBER Nursing notes reviewed and incorporated and Old chart reviewed   Differential diagnosis includes, but is not limited to, angioedema, allergic reaction, acute infection.  Patient is well-appearing and in no distress.  Vital signs are stable.  Apparently she did have some noticeable tongue swelling earlier but by the time I saw her, after she received Benadryl 50 mg by mouth, she says she is feeling all better wants to go home.  She was only in the emergency department about 2 hours.  I explained that typically for anyone having potential anaphylaxis or angioedema, would like to watch them for multiple hours to make sure things are getting worse.  She says she really wants to go home.  She has an EpiPen at home which she can use and she is married and her husband will be there with her and knows how to use the EpiPen as well.  I suggested a dose of Decadron  10 mg by mouth to help over the next couple of days in case she comes to contact with an allergen again and she agrees with this plan.  I gave strict return precautions should she develop any new or worsening symptoms and she again verify she has an EpiPen at home.           ____________________________________________  FINAL CLINICAL IMPRESSION(S) / ED DIAGNOSES  Final diagnoses:  Allergic reaction, initial encounter  Tongue swelling     MEDICATIONS GIVEN DURING THIS VISIT:  Medications  diphenhydrAMINE (BENADRYL) capsule 50 mg (50 mg Oral Given 08/21/20 0230)  dexamethasone (DECADRON) 10 MG/ML injection for Pediatric ORAL use 10 mg (10 mg Oral Given 08/21/20 0335)     ED Discharge Orders    None      *Please note:  Destiny [name] was evaluated in Emergency Department on 08/21/2020  for the symptoms described in the history of present illness. She was evaluated in the context of the global COVID-19 pandemic, which necessitated consideration that the patient might be at risk for infection with the SARS-CoV-2 virus that causes COVID-19. Institutional protocols and algorithms that pertain to the evaluation of patients at risk for COVID-19 are in a state of rapid change based on information released by regulatory bodies including the CDC and federal and state organizations. These policies and algorithms were followed during the patient's care in the ED.  Some ED evaluations and interventions may be delayed as a result of limited staffing during and after the pandemic.*  Note:  This document was prepared using Dragon voice recognition software and may include unintentional dictation errors.   Loleta Rose, MD 08/21/20 [phone]

## 2020-08-21 NOTE — Discharge Instructions (Signed)

## 2020-08-21 NOTE — ED Triage Notes (Signed)
Pt to ED from work c/o allergic reaction.  States tongue swelling, throat and chest tightness.  Unsure of what may have caused this.  Patient recently prescribed epi pen but did not use it today or any other meds.  Patient maintaining secretions, chest rise even and unlabored, skin WNL, in NAD at this time.

## 2020-09-28 ENCOUNTER — Other Ambulatory Visit: Payer: Medicaid Other

## 2020-09-29 ENCOUNTER — Other Ambulatory Visit: Payer: Medicaid Other

## 2020-09-30 ENCOUNTER — Other Ambulatory Visit: Payer: Medicaid Other

## 2020-10-01 ENCOUNTER — Other Ambulatory Visit: Payer: Medicaid Other

## 2020-10-01 DIAGNOSIS — Z20822 Contact with and (suspected) exposure to covid-19: Secondary | ICD-10-CM

## 2020-10-04 LAB — NOVEL CORONAVIRUS, NAA: SARS-CoV-2, NAA: NOT DETECTED

## 2020-10-04 LAB — SARS-COV-2, NAA 2 DAY TAT

## 2021-02-01 ENCOUNTER — Other Ambulatory Visit: Payer: Self-pay

## 2021-09-15 ENCOUNTER — Encounter: Payer: Self-pay | Admitting: Emergency Medicine

## 2021-09-15 ENCOUNTER — Emergency Department
Admission: EM | Admit: 2021-09-15 | Discharge: 2021-09-15 | Disposition: A | Discharge status: Home / Self Care | Payer: Medicaid Other | Attending: Emergency Medicine | Admitting: Emergency Medicine

## 2021-09-15 ENCOUNTER — Other Ambulatory Visit: Payer: Self-pay

## 2021-09-15 DIAGNOSIS — Z20822 Contact with and (suspected) exposure to covid-19: Secondary | ICD-10-CM | POA: Insufficient documentation

## 2021-09-15 DIAGNOSIS — J0100 Acute maxillary sinusitis, unspecified: Secondary | ICD-10-CM | POA: Diagnosis not present

## 2021-09-15 DIAGNOSIS — J029 Acute pharyngitis, unspecified: Secondary | ICD-10-CM | POA: Insufficient documentation

## 2021-09-15 DIAGNOSIS — R059 Cough, unspecified: Secondary | ICD-10-CM | POA: Diagnosis present

## 2021-09-15 LAB — RESP PANEL BY RT-PCR (FLU A&B, COVID) ARPGX2
Influenza A by PCR: NEGATIVE
Influenza B by PCR: NEGATIVE
SARS Coronavirus 2 by RT PCR: NEGATIVE

## 2021-09-15 MED ORDER — AMOXICILLIN 875 MG PO TABS: 875.0000 mg | ORAL_TABLET | Freq: Two times a day (BID) | ORAL | 0 refills | Status: DC

## 2021-09-15 NOTE — ED Provider Notes (Signed)
[facility] Emergency Department Provider Note  ____________________________________________   Event Date/Time   First MD Initiated Contact with Patient 09/15/21 1137     (approximate)  I have reviewed the triage vital signs and the nursing notes.   HISTORY  Chief Complaint Cough and Nasal Congestion    HPI Destiny [name] is a 20 y.o. female presents emergency department with URI symptoms.  Patient is complaining of cough, runny nose and sore throat for about 4 days.  States mucus is yellow and white.  No known fever or chills.  Patient states she is concerned as she works at [employer].  Denies chest pain or shortness of breath  No past medical history on file.  There are no problems to display for this patient.     Prior to Admission medications   Medication Sig Start Date End Date Taking? Authorizing Provider  amoxicillin (AMOXIL) 875 MG tablet Take 1 tablet (875 mg total) by mouth 2 (two) times daily. 09/15/21   Faythe Ghee, PA-C    Allergies Ambien [zolpidem], Lorazepam, and Zofran [ondansetron]  No family history on file.  Social History Social History   Tobacco Use   Smoking status: Never   Smokeless tobacco: Never    Review of Systems  Constitutional: No fever/chills Eyes: No visual changes. ENT: Positive sore throat. Respiratory: Positive cough Cardiovascular: Denies chest pain Gastrointestinal: Denies abdominal pain Genitourinary: Negative for dysuria. Musculoskeletal: Negative for back pain. Skin: Negative for rash. Psychiatric: no mood changes,     ____________________________________________   PHYSICAL EXAM:  VITAL SIGNS: ED Triage Vitals  Enc Vitals Group     BP 09/15/21 1051 132/77     Pulse Rate 09/15/21 1051 91     Resp 09/15/21 1051 20     Temp 09/15/21 1051 97.7 F (36.5 C)     Temp Source 09/15/21 1051 Oral     SpO2 09/15/21 1051 98 %     Weight 09/15/21 1049 170 lb (77.1 kg)     Height  09/15/21 1049 5\' 5"  (1.651 m)     Head Circumference --      Peak Flow --      Pain Score 09/15/21 1049 0     Pain Loc --      Pain Edu? --      Excl. in GC? --     Constitutional: Alert and oriented. Well appearing and in no acute distress. Eyes: Conjunctivae are normal.  Head: Atraumatic. Nose: Active congestion/rhinnorhea. Mouth/Throat: Mucous membranes are moist.   Neck:  supple no lymphadenopathy noted Cardiovascular: Normal rate, regular rhythm. Heart sounds are normal Respiratory: Normal respiratory effort.  No retractions, lungs c t a  GU: deferred Musculoskeletal: FROM all extremities, warm and well perfused Neurologic:  Normal speech and language.  Skin:  Skin is warm, dry and intact. No rash noted. Psychiatric: Mood and affect are normal. Speech and behavior are normal.  ____________________________________________   LABS (all labs ordered are listed, but only abnormal results are displayed)  Labs Reviewed  RESP PANEL BY RT-PCR (FLU A&B, COVID) ARPGX2   ____________________________________________   ____________________________________________  RADIOLOGY    ____________________________________________   PROCEDURES  Procedure(s) performed: No  Procedures    ____________________________________________   INITIAL IMPRESSION / ASSESSMENT AND PLAN / ED COURSE  Pertinent labs & imaging results that were available during my care of the patient were reviewed by me and considered in my medical decision making (see chart for details).   Patient is  a 20 year old female presents emergency department for URI symptoms.  See HPI.  Physical exam shows patient appears stable.  Also shows active congestion.  Lungs clear to auscultation  Respiratory panel is negative for COVID and influenza.  I did explain the findings to the patient.  Feel this is more of a sinusitis/URI.  We will place her on amoxicillin.  She is to take over-the-counter Mucinex, saline nose  spray, Flonase.  Return emergency department worsening.  She is discharged stable condition     Destiny [name] was evaluated in Emergency Department on 09/15/2021 for the symptoms described in the history of present illness. She was evaluated in the context of the global COVID-19 pandemic, which necessitated consideration that the patient might be at risk for infection with the SARS-CoV-2 virus that causes COVID-19. Institutional protocols and algorithms that pertain to the evaluation of patients at risk for COVID-19 are in a state of rapid change based on information released by regulatory bodies including the CDC and federal and state organizations. These policies and algorithms were followed during the patient's care in the ED.    As part of my medical decision making, I reviewed the following data within the electronic MEDICAL RECORD NUMBER Nursing notes reviewed and incorporated, Labs reviewed , Old chart reviewed, Notes from prior ED visits, and [place] Controlled Substance Database  ____________________________________________   FINAL CLINICAL IMPRESSION(S) / ED DIAGNOSES  Final diagnoses:  Acute maxillary sinusitis, recurrence not specified      NEW MEDICATIONS STARTED DURING THIS VISIT:  Current Discharge Medication List     START taking these medications   Details  amoxicillin (AMOXIL) 875 MG tablet Take 1 tablet (875 mg total) by mouth 2 (two) times daily. Qty: 20 tablet, Refills: 0         Note:  This document was prepared using Dragon voice recognition software and may include unintentional dictation errors.    Faythe Ghee, PA-C 09/15/21 1214    Sharman Cheek, MD 09/15/21 [phone]

## 2021-09-15 NOTE — ED Triage Notes (Signed)
Pt via POV from home. Pt c/o cough, runny nose, and sore throat. Denies any fevers. Pt is A&Ox4 and NAD.

## 2021-09-15 NOTE — Discharge Instructions (Addendum)
Use over-the-counter saline nose rinse, over-the-counter Flonase, over-the-counter Mucinex Return emergency department if worsening

## 2021-11-21 ENCOUNTER — Emergency Department
Admission: EM | Admit: 2021-11-21 | Discharge: 2021-11-21 | Disposition: A | Discharge status: Home / Self Care | Payer: Medicaid Other | Attending: Emergency Medicine | Admitting: Emergency Medicine

## 2021-11-21 ENCOUNTER — Other Ambulatory Visit: Payer: Self-pay

## 2021-11-21 ENCOUNTER — Encounter: Payer: Self-pay | Admitting: Emergency Medicine

## 2021-11-21 DIAGNOSIS — R0602 Shortness of breath: Secondary | ICD-10-CM | POA: Insufficient documentation

## 2021-11-21 DIAGNOSIS — R0789 Other chest pain: Secondary | ICD-10-CM | POA: Diagnosis not present

## 2021-11-21 DIAGNOSIS — J45909 Unspecified asthma, uncomplicated: Secondary | ICD-10-CM | POA: Insufficient documentation

## 2021-11-21 DIAGNOSIS — T7840XA Allergy, unspecified, initial encounter: Secondary | ICD-10-CM | POA: Diagnosis present

## 2021-11-21 MED ORDER — ALBUTEROL SULFATE (2.5 MG/3ML) 0.083% IN NEBU
5.0000 mg | INHALATION_SOLUTION | Freq: Once | RESPIRATORY_TRACT | Status: AC
  Administered 2021-11-21: 5 mg via RESPIRATORY_TRACT
  Filled 2021-11-21: qty 6

## 2021-11-21 MED ORDER — EPINEPHRINE 0.3 MG/0.3ML IJ SOAJ: 0.3000 mg | INTRAMUSCULAR | 1 refills | Status: DC | PRN

## 2021-11-21 MED ORDER — DIPHENHYDRAMINE HCL 50 MG/ML IJ SOLN
50.0000 mg | Freq: Once | INTRAMUSCULAR | Status: AC
  Administered 2021-11-21: 50 mg via INTRAVENOUS
  Filled 2021-11-21: qty 1

## 2021-11-21 MED ORDER — METHYLPREDNISOLONE SODIUM SUCC 125 MG IJ SOLR
125.0000 mg | Freq: Once | INTRAMUSCULAR | Status: AC
  Administered 2021-11-21: 125 mg via INTRAVENOUS
  Filled 2021-11-21: qty 2

## 2021-11-21 MED ORDER — FAMOTIDINE IN NACL 20-0.9 MG/50ML-% IV SOLN
20.0000 mg | Freq: Once | INTRAVENOUS | Status: AC
Start: 2021-11-21 — End: 2021-11-21
  Administered 2021-11-21: 20 mg via INTRAVENOUS
  Filled 2021-11-21: qty 50

## 2021-11-21 NOTE — ED Triage Notes (Addendum)
Pt to ED from home c/o possible allergic reaction.  States woke up tonight sweating, unable to go back to sleep, felt like tongue was swollen.  Pt denies hives, rash, no obvious swelling to tongue or lips currently, pt is maintaining secretions, chest rise even and unlabored, and speaking in complete and coherent sentences, unsure of known allergies she may have come in contact with. ?

## 2021-11-21 NOTE — ED Provider Notes (Signed)
? ?[facility] ?Provider Note ? ? ? Event Date/Time  ? First MD Initiated Contact with Patient 11/21/21 0153   ?  (approximate) ? ? ?History  ? ?Allergic Reaction ? ? ?HPI ? ?Destiny [name] is a 21 y.o. female with a history of asthma who presents for evaluation of an allergic reaction.  Patient reports that she usually has a couple of these episodes every month.  Unknown allergens.  She reports being in her usual state of health when she went to sleep with.  She woke up and felt like her throat is closing, felt like her tongue was swollen, she felt tightness in her chest and shortness of breath.  No wheezing, no vomiting, no diarrhea, no hives.  Patient denies any new medications, any new foods.  Denies any anaphylactic reaction in the past. ?  ? ? ?Past Medical History:  ?Diagnosis Date  ? Asthma   ? Insomnia   ? Ovarian cyst   ? ? ?Past Surgical History:  ?Procedure Laterality Date  ? TONSILLECTOMY    ? ? ? ?Physical Exam  ? ?Triage Vital Signs: ?ED Triage Vitals  ?Enc Vitals Group  ?   BP 11/21/21 0134 (!) 153/102  ?   Pulse Rate 11/21/21 0134 75  ?   Resp 11/21/21 0134 16  ?   Temp 11/21/21 0134 98.3 ?F (36.8 ?C)  ?   Temp Source 11/21/21 0134 Oral  ?   SpO2 11/21/21 0134 98 %  ?   Weight 11/21/21 0135 180 lb (81.6 kg)  ?   Height 11/21/21 0135 5\' 5"  (1.651 m)  ?   Head Circumference --   ?   Peak Flow --   ?   Pain Score 11/21/21 0135 0  ?   Pain Loc --   ?   Pain Edu? --   ?   Excl. in GC? --   ? ? ?Most recent vital signs: ?Vitals:  ? 11/21/21 0134  ?BP: (!) 153/102  ?Pulse: 75  ?Resp: 16  ?Temp: 98.3 ?F (36.8 ?C)  ?SpO2: 98%  ? ? ? ?Constitutional: Alert and oriented. Well appearing and in no apparent distress. ?HEENT: ?     Head: Normocephalic and atraumatic.    ?     Eyes: Conjunctivae are normal. Sclera is non-icteric.  ?     Mouth/Throat: Mucous membranes are moist.  Tongue and uvula are midline with no signs of swelling, no signs of angioedema, no stridor, airways patent ?      Neck: Supple with no signs of meningismus. ?Cardiovascular: Regular rate and rhythm. No murmurs, gallops, or rubs. 2+ symmetrical distal pulses are present in all extremities.  ?Respiratory: Normal respiratory effort. Lungs are clear to auscultation bilaterally.  Diminished air movement bilaterally ?Gastrointestinal: Soft, non tender, and non distended with positive bowel sounds. No rebound or guarding. ?Genitourinary: No CVA tenderness. ?Musculoskeletal:  No edema, cyanosis, or erythema of extremities. ?Neurologic: Normal speech and language. Face is symmetric. Moving all extremities. No gross focal neurologic deficits are appreciated. ?Skin: Skin is warm, dry and intact. No rash noted. ?Psychiatric: Mood and affect are normal. Speech and behavior are normal. ? ?ED Results / Procedures / Treatments  ? ?Labs ?(all labs ordered are listed, but only abnormal results are displayed) ?Labs Reviewed - No data to display ? ? ?EKG ? ?none ? ? ?RADIOLOGY ?none ? ? ?PROCEDURES: ? ?Critical Care performed: No ? ?Procedures ? ? ? ?IMPRESSION / MDM / ASSESSMENT AND PLAN /  ED COURSE  ?I reviewed the triage vital signs and the nursing notes. ? ?21 y.o. female with a history of asthma who presents for evaluation of an allergic reaction.  No signs of anaphylaxis.  Mild allergic reaction.  She is not wheezing and in no respiratory distress but does have decreased air movement bilaterally possible from mild bronchospasm in the setting of an allergic reaction.  Plan is to treat with albuterol, IV Pepcid, Solu-Medrol, Benadryl and monitor closely.  Unknown allergen. ? ?MEDICATIONS GIVEN IN ED: ?Medications  ?albuterol (PROVENTIL) (2.5 MG/3ML) 0.083% nebulizer solution 5 mg (5 mg Nebulization Given 11/21/21 0244)  ?famotidine (PEPCID) IVPB 20 mg premix (20 mg Intravenous New Bag/Given 11/21/21 0241)  ?methylPREDNISolone sodium succinate (SOLU-MEDROL) 125 mg/2 mL injection 125 mg (125 mg Intravenous Given 11/21/21 0238)  ?diphenhydrAMINE  (BENADRYL) injection 50 mg (50 mg Intravenous Given 11/21/21 0240)  ? ? ? ?ED COURSE: Patient's symptoms fully resolved after albuterol, Pepcid, Solu-Medrol and Benadryl.  She was monitored for greater than 3 hours post onset of symptoms.  Her symptoms are fully resolved.  Remains with no signs of anaphylaxis.  Patient is requesting to be discharged at this time.  No indication for admission.  EpiPen was provided.  We discussed how to use it and when to use an EpiPen. ? ? ?Consults: None ? ? ?EMR reviewed including prior visits to the ER for allergic reaction ? ? ? ?FINAL CLINICAL IMPRESSION(S) / ED DIAGNOSES  ? ?Final diagnoses:  ?Allergic reaction, initial encounter  ? ? ? ?Rx / DC Orders  ? ?ED Discharge Orders   ? ?      Ordered  ?  EPINEPHrine 0.3 mg/0.3 mL IJ SOAJ injection  As needed       ? 11/21/21 0422  ? ?  ?  ? ?  ? ? ? ?Note:  This document was prepared using Dragon voice recognition software and may include unintentional dictation errors. ? ? ?Please note:  Patient was evaluated in Emergency Department today for the symptoms described in the history of present illness. Patient was evaluated in the context of the global COVID-19 pandemic, which necessitated consideration that the patient might be at risk for infection with the SARS-CoV-2 virus that causes COVID-19. Institutional protocols and algorithms that pertain to the evaluation of patients at risk for COVID-19 are in a state of rapid change based on information released by regulatory bodies including the CDC and federal and state organizations. These policies and algorithms were followed during the patient's care in the ED.  Some ED evaluations and interventions may be delayed as a result of limited staffing during the pandemic. ? ? ? ? ?  ?Nita Sickle, MD ?11/21/21 0423 ? ?

## 2022-01-12 ENCOUNTER — Emergency Department
Admission: EM | Admit: 2022-01-12 | Discharge: 2022-01-12 | Discharge status: Against Medical Advice | Payer: Medicaid Other | Attending: Emergency Medicine | Admitting: Emergency Medicine

## 2022-01-12 DIAGNOSIS — R21 Rash and other nonspecific skin eruption: Secondary | ICD-10-CM | POA: Diagnosis not present

## 2022-01-12 DIAGNOSIS — R0602 Shortness of breath: Secondary | ICD-10-CM | POA: Insufficient documentation

## 2022-01-12 DIAGNOSIS — Z5321 Procedure and treatment not carried out due to patient leaving prior to being seen by health care provider: Secondary | ICD-10-CM | POA: Insufficient documentation

## 2022-01-12 NOTE — ED Triage Notes (Signed)
Pt comes pov with possible allergic reaction. Pt was at work yesterday and states she started having shob, rash and took a benadryl and felt better. Today, woke up with same symptoms. Had benadryl last at 2am.  ?

## 2022-01-12 NOTE — ED Notes (Signed)
Pt reports is feeling better and is leaving ?

## 2022-01-12 NOTE — ED Triage Notes (Signed)
Pt called from [place] to treatment room. Pt reports that she feels better and is leaving. Pt encouraged to stay and see the MD since were were headed back to a room but pt states that she is feeling better and is just going to leave. ?

## 2022-02-02 ENCOUNTER — Emergency Department
Admission: EM | Admit: 2022-02-02 | Discharge: 2022-02-02 | Discharge status: Against Medical Advice | Payer: Medicaid Other | Attending: Emergency Medicine | Admitting: Emergency Medicine

## 2022-02-02 ENCOUNTER — Other Ambulatory Visit: Payer: Self-pay

## 2022-02-02 ENCOUNTER — Encounter: Payer: Self-pay | Admitting: Emergency Medicine

## 2022-02-02 DIAGNOSIS — Z5321 Procedure and treatment not carried out due to patient leaving prior to being seen by health care provider: Secondary | ICD-10-CM | POA: Diagnosis not present

## 2022-02-02 DIAGNOSIS — L559 Sunburn, unspecified: Secondary | ICD-10-CM | POA: Insufficient documentation

## 2022-02-02 NOTE — ED Notes (Signed)
Pt given 4 ice packs upon request ?

## 2022-02-02 NOTE — ED Notes (Signed)
Pt states, "Ice packs are really helping."  Pt given 2 more ice packs upon request. ?

## 2022-02-02 NOTE — ED Notes (Signed)
Pt told pt access staff she was leaving. ?

## 2022-02-02 NOTE — ED Triage Notes (Signed)
Pt to ED from home c/o sunburn today.  States [place] the beach today and used sunscreen spf 15.  Pt A&Ox4, chest rise even and unlabored, feels dehydrated, in NAD at this time. ?

## 2022-04-06 ENCOUNTER — Other Ambulatory Visit: Payer: Self-pay

## 2022-04-06 ENCOUNTER — Emergency Department: Payer: Medicaid Other

## 2022-04-06 ENCOUNTER — Emergency Department
Admission: EM | Admit: 2022-04-06 | Discharge: 2022-04-06 | Disposition: A | Discharge status: Home / Self Care | Payer: Medicaid Other | Attending: Emergency Medicine | Admitting: Emergency Medicine

## 2022-04-06 DIAGNOSIS — Z3A01 Less than 8 weeks gestation of pregnancy: Secondary | ICD-10-CM | POA: Diagnosis not present

## 2022-04-06 DIAGNOSIS — N9489 Other specified conditions associated with female genital organs and menstrual cycle: Secondary | ICD-10-CM | POA: Diagnosis not present

## 2022-04-06 DIAGNOSIS — O26811 Pregnancy related exhaustion and fatigue, first trimester: Secondary | ICD-10-CM | POA: Insufficient documentation

## 2022-04-06 DIAGNOSIS — O26891 Other specified pregnancy related conditions, first trimester: Secondary | ICD-10-CM | POA: Insufficient documentation

## 2022-04-06 DIAGNOSIS — J45909 Unspecified asthma, uncomplicated: Secondary | ICD-10-CM | POA: Insufficient documentation

## 2022-04-06 LAB — URINALYSIS, ROUTINE W REFLEX MICROSCOPIC
Bilirubin Urine: NEGATIVE
Glucose, UA: NEGATIVE mg/dL
Ketones, ur: NEGATIVE mg/dL
Nitrite: NEGATIVE
Protein, ur: NEGATIVE mg/dL
Specific Gravity, Urine: 1.018 (ref 1.005–1.030)
pH: 6 (ref 5.0–8.0)

## 2022-04-06 LAB — ABO/RH: ABO/RH(D): O POS

## 2022-04-06 LAB — COMPREHENSIVE METABOLIC PANEL
ALT: 31 U/L (ref 0–44)
AST: 26 U/L (ref 15–41)
Albumin: 4.4 g/dL (ref 3.5–5.0)
Alkaline Phosphatase: 90 U/L (ref 38–126)
Anion gap: 9 (ref 5–15)
BUN: 8 mg/dL (ref 6–20)
CO2: 21 mmol/L — ABNORMAL LOW (ref 22–32)
Calcium: 9.1 mg/dL (ref 8.9–10.3)
Chloride: 107 mmol/L (ref 98–111)
Creatinine, Ser: 0.57 mg/dL (ref 0.44–1.00)
GFR, Estimated: >60 mL/min (ref >60)
Glucose, Bld: 102 mg/dL — ABNORMAL HIGH (ref 70–99)
Potassium: 3.7 mmol/L (ref 3.5–5.1)
Sodium: 137 mmol/L (ref 135–145)
Total Bilirubin: 0.6 mg/dL (ref 0.3–1.2)
Total Protein: 7.6 g/dL (ref 6.5–8.1)

## 2022-04-06 LAB — CBC
HCT: 44.2 % (ref 36.0–46.0)
Hemoglobin: 14.3 g/dL (ref 12.0–15.0)
MCH: 28.4 pg (ref 26.0–34.0)
MCHC: 32.4 g/dL (ref 30.0–36.0)
MCV: 87.9 fL (ref 80.0–100.0)
Platelets: 344 10*3/uL (ref 150–400)
RBC: 5.03 MIL/uL (ref 3.87–5.11)
RDW: 13.2 % (ref 11.5–15.5)
WBC: 14.9 10*3/uL — ABNORMAL HIGH (ref 4.0–10.5)
nRBC: 0 % (ref 0.0–0.2)

## 2022-04-06 LAB — HCG, QUANTITATIVE, PREGNANCY: hCG, Beta Chain, Quant, S: 1383 m[IU]/mL — ABNORMAL HIGH (ref <5)

## 2022-04-06 LAB — LIPASE, BLOOD: Lipase: 35 U/L (ref 11–51)

## 2022-04-06 NOTE — ED Triage Notes (Addendum)
Pt comes with c/o fatigue for the last 4 days. Pt denies any N/V/D. Pt states she just found out she is about [redacted] week pregnant. Pt denies any vaginal bleeding.   Pt also states right lower pain for about 2 weeks.

## 2022-04-06 NOTE — ED Notes (Signed)
Patient declined discharge vital signs. 

## 2022-04-06 NOTE — ED Provider Triage Note (Signed)
Emergency Medicine Provider Triage Evaluation Note  Destiny [name] , a 21 y.o. female  was evaluated in triage.  Pt complains of patient states she is about [redacted] weeks pregnant, right lower quadrant pain.  Review of Systems  Positive: Right lower quadrant abdominal pain, fatigue Negative: Fever chills, bleeding  Physical Exam  BP (!) 147/93   Pulse (!) 105   Temp 98.5 F (36.9 C)   Resp 18   SpO2 96%  Gen:   Awake, no distress   Resp:  Normal effort  MSK:   Moves extremities without difficulty  Other:    Medical Decision Making  Medically screening exam initiated at 8:04 AM.  Appropriate orders placed.  Leonie Engebretson was informed that the remainder of the evaluation will be completed by another provider, this initial triage assessment does not replace that evaluation, and the importance of remaining in the ED until their evaluation is complete.     Faythe Ghee, PA-C 04/06/22 [phone]

## 2022-04-06 NOTE — ED Notes (Signed)
Called lab to add on ABO

## 2022-04-06 NOTE — ED Notes (Signed)
Patient ambulated to room with steady gait. Denies nausea at this time.

## 2022-04-06 NOTE — ED Notes (Signed)
Waiting for room. NAD

## 2022-04-08 ENCOUNTER — Telehealth: Payer: Self-pay | Admitting: Family Medicine

## 2022-04-08 NOTE — Telephone Encounter (Signed)
Pt is not a MH pt but she called wanting to schedule an appt to get her HCG levels checked.  Evidently she was [redacted] weeks pregnant and went to the ER and they didn't find anything on the ultrasound.  They reccommended she call us to get her HCG levels checked.

## 2022-04-08 NOTE — Telephone Encounter (Signed)
PT appointment scheduled for 04-09-22 at 10:10 am. Hart Carwin, RN

## 2022-04-08 NOTE — Telephone Encounter (Signed)
Spoke with provider Marye Round, CNM regarding situation. Provider suggest patient make an appt for nurse clinic for pregnancy test. If pregnancy test is positive, then have HCG drawn at that visit as well (per verbal order of E. Sciora, CNM).   Will notify B. Amish, RN to contact patient.   Earlyne Iba, RN

## 2022-04-08 NOTE — Telephone Encounter (Signed)
Return call to patient today regarding her message regarding Hcg Levels.   Per Arnetha Courser, CNM consult with Earlyne Iba, RN : patient notified she needed a PT appointment in Nurse Clinic.  Depending upon that result would depend on other labs.  Patient given the appointment line number to call to schedule her PT appointment.  Hart Carwin, RN

## 2022-04-09 ENCOUNTER — Ambulatory Visit (LOCAL_COMMUNITY_HEALTH_CENTER): Payer: Medicaid Other

## 2022-04-09 VITALS — BP 121/80 | Ht 66.0 in | Wt 197.5 lb

## 2022-04-09 DIAGNOSIS — Z3201 Encounter for pregnancy test, result positive: Secondary | ICD-10-CM | POA: Diagnosis not present

## 2022-04-09 LAB — PREGNANCY, URINE: Preg Test, Ur: POSITIVE — AB

## 2022-04-09 MED ORDER — PRENATAL 27-0.8 MG PO TABS: 1.0000 | ORAL_TABLET | Freq: Every day | ORAL | 0 refills | Status: AC

## 2022-04-09 NOTE — Progress Notes (Signed)
In Nurse Clinic for UPT which was positive. Pt reports she was seen in ED at [facility] on 04/06/2022 and had labs and ultrasound and no sac was seen. Visited ED d/t symptoms of fatigue and exhaustion. Denies cramping and vaginal bleeding. States ED recommended f-u HCG level. Pt has new OB appt scheduled at [facility] on 05/08/2022.  RN consulted A White, FNP who gives ok for f-u HCG level in 7 days (04/16/22).    When RN returned to room after consult with provider, patient explains that [facility] - OB dept just messaged her and they agree to f-u with her for HCG level. Pt states she will plan to visit [facility] for f-u.    The patient was dispensed Prenatal vitamins #100 today. I provided counseling today regarding the medication. We discussed the medication, the side effects and when to call clinic. Patient given the opportunity to ask questions. Questions answered.    RN completed preg test visit and proof of preg given to pt. Pt advised to see DSS for medicaid/preg women. Jerel Shepherd, RN

## 2022-04-09 NOTE — Progress Notes (Signed)
Consulted by RN re: patient situation.  Reviewed RN note and agree that it reflects our discussion and my recommendations. Fatima Fedie, FNP  

## 2022-04-18 NOTE — ED Provider Notes (Signed)
[facility] Provider Note    Event Date/Time   First MD Initiated Contact with Patient 04/06/22 1110     (approximate)   History   Fatigue   HPI  Destiny [name] is a 21 y.o. female  with history of asthma presents to the emergency department due to fatigue for the past 4 days. She is about [redacted] weeks pregnant. Denies vaginal bleeding, discharge, or dysuria. No fever, nausea, or vomiting. She has had some intermittent RLQ pain for the past 2 weeks, but none today.  Past Medical History:  Diagnosis Date   Asthma    Insomnia    Ovarian cyst      Physical Exam   Triage Vital Signs: ED Triage Vitals  Enc Vitals Group     BP 04/06/22 0757 (!) 147/93     Pulse Rate 04/06/22 0757 (!) 105     Resp 04/06/22 0757 18     Temp 04/06/22 0757 98.5 F (36.9 C)     Temp src --      SpO2 04/06/22 0757 96 %     Weight --      Height --      Head Circumference --      Peak Flow --      Pain Score 04/06/22 0756 3     Pain Loc --      Pain Edu? --      Excl. in GC? --     Most recent vital signs: Vitals:   04/06/22 0757 04/06/22 1010  BP: (!) 147/93 124/74  Pulse: (!) 105 84  Resp: 18 16  Temp: 98.5 F (36.9 C)   SpO2: 96% 94%    General: Awake, no distress.  CV:  Good peripheral perfusion.  Resp:  Normal effort.  Abd:  No distention. Soft, non-tender to palpation Other:     ED Results / Procedures / Treatments   Labs (all labs ordered are listed, but only abnormal results are displayed) Labs Reviewed  COMPREHENSIVE METABOLIC PANEL - Abnormal; Notable for the following components:      Result Value   CO2 21 (*)    Glucose, Bld 102 (*)    All other components within normal limits  CBC - Abnormal; Notable for the following components:   WBC 14.9 (*)    All other components within normal limits  URINALYSIS, ROUTINE W REFLEX MICROSCOPIC - Abnormal; Notable for the following components:   Color, Urine YELLOW (*)    APPearance HAZY (*)    Hgb  urine dipstick SMALL (*)    Leukocytes,Ua TRACE (*)    Bacteria, UA RARE (*)    All other components within normal limits  HCG, QUANTITATIVE, PREGNANCY - Abnormal; Notable for the following components:   hCG, Beta Chain, Quant, S 1,383 (*)    All other components within normal limits  LIPASE, BLOOD  ABO/RH     EKG  Not indicated   RADIOLOGY  Pregnancy of unknown anatomical location.  I have independently reviewed and interpreted imaging as well as reviewed report from radiology.  PROCEDURES:  Critical Care performed: No  Procedures   MEDICATIONS ORDERED IN ED:  Medications - No data to display   IMPRESSION / MDM / ASSESSMENT AND PLAN / ED COURSE   I reviewed the triage vital signs and the nursing notes.  Differential diagnosis includes, but is not limited to: Fatigue related to pregnancy, acute cystitis, poor nutrition  Patient's presentation is most consistent with acute complicated  illness / injury requiring diagnostic workup.  21 year old G1P0 presents to the ER for evaluation of fatigue during pregnancy.  Workup including labs, urinalysis, and [place] are reassuring. She will need to have a repeat US and beta HCG in 2 weeks or sooner if she begins to have vaginal bleeding or worsening abdominal pain. These instructions were discussed with her prior to discharge. She is to follow up with OB GYN. ER return precautions discussed as well.      FINAL CLINICAL IMPRESSION(S) / ED DIAGNOSES   Final diagnoses:  Pregnancy related exhaustion and fatigue in first trimester     Rx / DC Orders   ED Discharge Orders     None        Note:  This document was prepared using Dragon voice recognition software and may include unintentional dictation errors.   Chinita Pester, FNP 04/18/22 [name]    Shaune Pollack, MD 04/21/22 [phone]

## 2022-04-27 ENCOUNTER — Emergency Department
Admission: EM | Admit: 2022-04-27 | Discharge: 2022-04-27 | Disposition: A | Discharge status: Home / Self Care | Payer: Medicaid Other | Attending: Emergency Medicine | Admitting: Emergency Medicine

## 2022-04-27 ENCOUNTER — Emergency Department: Payer: Medicaid Other

## 2022-04-27 ENCOUNTER — Other Ambulatory Visit: Payer: Self-pay

## 2022-04-27 ENCOUNTER — Encounter: Payer: Self-pay | Admitting: Emergency Medicine

## 2022-04-27 DIAGNOSIS — Z6740 Type O blood, Rh positive: Secondary | ICD-10-CM | POA: Diagnosis not present

## 2022-04-27 DIAGNOSIS — B9689 Other specified bacterial agents as the cause of diseases classified elsewhere: Secondary | ICD-10-CM | POA: Diagnosis not present

## 2022-04-27 DIAGNOSIS — O2341 Unspecified infection of urinary tract in pregnancy, first trimester: Secondary | ICD-10-CM | POA: Diagnosis not present

## 2022-04-27 DIAGNOSIS — O4690 Antepartum hemorrhage, unspecified, unspecified trimester: Secondary | ICD-10-CM

## 2022-04-27 DIAGNOSIS — Z3A01 Less than 8 weeks gestation of pregnancy: Secondary | ICD-10-CM | POA: Diagnosis not present

## 2022-04-27 DIAGNOSIS — O209 Hemorrhage in early pregnancy, unspecified: Secondary | ICD-10-CM | POA: Diagnosis present

## 2022-04-27 DIAGNOSIS — O368911 Maternal care for other specified fetal problems, first trimester, fetus 1: Secondary | ICD-10-CM | POA: Insufficient documentation

## 2022-04-27 DIAGNOSIS — O468X1 Other antepartum hemorrhage, first trimester: Secondary | ICD-10-CM

## 2022-04-27 LAB — CBC
HCT: 43.5 % (ref 36.0–46.0)
Hemoglobin: 14.5 g/dL (ref 12.0–15.0)
MCH: 29 pg (ref 26.0–34.0)
MCHC: 33.3 g/dL (ref 30.0–36.0)
MCV: 87 fL (ref 80.0–100.0)
Platelets: 314 10*3/uL (ref 150–400)
RBC: 5 MIL/uL (ref 3.87–5.11)
RDW: 13.2 % (ref 11.5–15.5)
WBC: 15.4 10*3/uL — ABNORMAL HIGH (ref 4.0–10.5)
nRBC: 0 % (ref 0.0–0.2)

## 2022-04-27 LAB — COMPREHENSIVE METABOLIC PANEL
ALT: 31 U/L (ref 0–44)
AST: 23 U/L (ref 15–41)
Albumin: 4 g/dL (ref 3.5–5.0)
Alkaline Phosphatase: 82 U/L (ref 38–126)
Anion gap: 9 (ref 5–15)
BUN: 6 mg/dL (ref 6–20)
CO2: 19 mmol/L — ABNORMAL LOW (ref 22–32)
Calcium: 9.1 mg/dL (ref 8.9–10.3)
Chloride: 109 mmol/L (ref 98–111)
Creatinine, Ser: 0.48 mg/dL (ref 0.44–1.00)
GFR, Estimated: >60 mL/min (ref >60)
Glucose, Bld: 93 mg/dL (ref 70–99)
Potassium: 3.7 mmol/L (ref 3.5–5.1)
Sodium: 137 mmol/L (ref 135–145)
Total Bilirubin: 0.8 mg/dL (ref 0.3–1.2)
Total Protein: 7.5 g/dL (ref 6.5–8.1)

## 2022-04-27 LAB — URINALYSIS, ROUTINE W REFLEX MICROSCOPIC
Bilirubin Urine: NEGATIVE
Glucose, UA: NEGATIVE mg/dL
Ketones, ur: NEGATIVE mg/dL
Nitrite: NEGATIVE
Protein, ur: 30 mg/dL — AB
Specific Gravity, Urine: 1.014 (ref 1.005–1.030)
pH: 7 (ref 5.0–8.0)

## 2022-04-27 LAB — WET PREP, GENITAL
Sperm: NONE SEEN
Trich, Wet Prep: NONE SEEN
WBC, Wet Prep HPF POC: >=10 — AB (ref <10)
Yeast Wet Prep HPF POC: NONE SEEN

## 2022-04-27 LAB — CHLAMYDIA/NGC RT PCR (ARMC ONLY)
Chlamydia Tr: NOT DETECTED
N gonorrhoeae: NOT DETECTED

## 2022-04-27 LAB — ABO/RH: ABO/RH(D): O POS

## 2022-04-27 LAB — LIPASE, BLOOD: Lipase: 30 U/L (ref 11–51)

## 2022-04-27 LAB — HCG, QUANTITATIVE, PREGNANCY: hCG, Beta Chain, Quant, S: 120608 m[IU]/mL — ABNORMAL HIGH (ref <5)

## 2022-04-27 MED ORDER — METRONIDAZOLE 250 MG PO TABS: 250.0000 mg | ORAL_TABLET | Freq: Three times a day (TID) | ORAL | 0 refills | Status: AC

## 2022-04-27 MED ORDER — ACETAMINOPHEN 500 MG PO TABS
1000.0000 mg | ORAL_TABLET | Freq: Once | ORAL | Status: AC
  Administered 2022-04-27: 1000 mg via ORAL
  Filled 2022-04-27: qty 2

## 2022-04-27 NOTE — Discharge Instructions (Addendum)
We discussed the benefits and risk of using Flagyl and asymptomatic positive BV.  There is no signs of miscarriage at this time but we cannot predict the future and  continue to monitor symptoms and follow-up with your OB and further discuss Flagyl with your OB.  Return to the ER if develop more than 1 pad an hour of bleeding or worsening severe pain Control at home    IMPRESSION:  1. Twin pregnancy, with 2 single live intrauterine embryos,  symmetric sizes.  2. Small subchronic hemorrhage.  No other pregnancy complication.

## 2022-04-27 NOTE — ED Provider Notes (Signed)
[facility] Provider Note    Event Date/Time   First MD Initiated Contact with Patient 04/27/22 [id number]     (approximate)   History   Abdominal Pain and Vaginal Bleeding   HPI  Destiny [name] is a 21 y.o. female who is currently [redacted] weeks pregnant with twins who comes in this morning for vaginal bleeding abdominal cramping.  Patient reports this morning having a little bit of vaginal spotting and some lower abdominal cramping.  She denies it being more than a pad an hour and less than a menstruation.  She denies any vaginal discharge.  She presents today with her female partner.  They report that they did not use IVF and that they did donor sperm insemination.  Physical Exam   Triage Vital Signs: ED Triage Vitals [04/27/22 0820]  Enc Vitals Group     BP      Pulse      Resp      Temp      Temp src      SpO2      Weight 196 lb 3.4 oz (89 kg)     Height 5\' 6"  (1.676 m)     Head Circumference      Peak Flow      Pain Score 4     Pain Loc      Pain Edu?      Excl. in GC?     Most recent vital signs: Vitals:   04/27/22 0843 04/27/22 0905  BP: 126/80 116/69  Pulse: (!) 114 86  Resp: 20 15  Temp: 98.4 F (36.9 C)   SpO2: 98% 94%     General: Awake, no distress.  CV:  Good peripheral perfusion.  Resp:  Normal effort.  Abd:  No distention.  Soft and nontender Other:     ED Results / Procedures / Treatments   Labs (all labs ordered are listed, but only abnormal results are displayed) Labs Reviewed  COMPREHENSIVE METABOLIC PANEL - Abnormal; Notable for the following components:      Result Value   CO2 19 (*)    All other components within normal limits  CBC - Abnormal; Notable for the following components:   WBC 15.4 (*)    All other components within normal limits  URINALYSIS, ROUTINE W REFLEX MICROSCOPIC - Abnormal; Notable for the following components:   Color, Urine YELLOW (*)    APPearance HAZY (*)    Hgb urine dipstick MODERATE  (*)    Protein, ur 30 (*)    Leukocytes,Ua SMALL (*)    Bacteria, UA RARE (*)    All other components within normal limits  HCG, QUANTITATIVE, PREGNANCY - Abnormal; Notable for the following components:   hCG, Beta Chain, Quant, S 120,608 (*)    All other components within normal limits  WET PREP, GENITAL  CHLAMYDIA/NGC RT PCR ([facility] ONLY)            LIPASE, BLOOD  ABO/RH     RADIOLOGY Unable to see 06/27/22 pictures  IMPRESSION: 1. Twin pregnancy, with 2 single live intrauterine embryos, symmetric sizes. 2. Small subchronic hemorrhage.  No other pregnancy complication.      PROCEDURES:  Critical Care performed: No  .1-3 Lead EKG Interpretation  Performed by: [place], MD Authorized by: Concha Se, MD     Interpretation: normal     ECG rate:  70   ECG rate assessment: normal     Rhythm: sinus rhythm  Ectopy: none     Conduction: normal      MEDICATIONS ORDERED IN ED: Medications - No data to display   IMPRESSION / MDM / ASSESSMENT AND PLAN / ED COURSE  I reviewed the triage vital signs and the nursing notes.   Patient's presentation is most consistent with acute presentation with potential threat to life or bodily function.   Differential includes Ectopic versus miscarriage versus threatened miscarriage.  Discussed with patient seems less likely ectopic given she does report ultrasound from [facility] that does show twins but given the concern and that she does have twins will get repeat ultrasound to see if there is an issue with one of the twins so we can further help the patient decide on next steps.  Labs ordered evaluate for any anemia, hCG levels.  She seems low risk for STDs but given she has not had any testing at this pregnancy patient would prefer to self swab and she has declined a pelvic exam today.  Patient initially tachycardic but without any interventions heart rates have come down.  Patient is O+ does not need RhoGAM.  Lipase normal.  CO2  slightly low but anion gap is normal.  White count slightly elevated but similar to 3 weeks ago and she denies any other infectious symptoms.  She is afebrile.  Her urine does have some WBCs but also a lot of squamous cells we will send for culture to make sure no evidence of asymptomatic bacteriuria.  Her hCG is elevated as expected  IMPRESSION: 1. Twin pregnancy, with 2 single live intrauterine embryos, symmetric sizes. 2. Small subchronic hemorrhage.  No other pregnancy complication.     Discussed with patient and her ultrasound finding.  We discussed the risk of having a miscarriage but no evidence of current miscarriage at this time.  The bleeding could be from the subchorionic hemorrhage.  Patient was noted to have positive BV.  She denies any symptoms we discussed the benefits and risk of treatment I given her prescription and she wants to discuss further with her OB/GYN.  At this time patient's bleeding has mostly resolved and patient feels comfortable with discharge home.    The patient is on the cardiac monitor to evaluate for evidence of arrhythmia and/or significant heart rate changes.      FINAL CLINICAL IMPRESSION(S) / ED DIAGNOSES   Final diagnoses:  Vaginal bleeding in pregnancy  Subchorionic hematoma in first trimester, single or unspecified fetus  Bacterial vaginosis in pregnancy     Rx / DC Orders   ED Discharge Orders          Ordered    metroNIDAZOLE (FLAGYL) 250 MG tablet  3 times daily        04/27/22 1255             Note:  This document was prepared using Dragon voice recognition software and may include unintentional dictation errors.   Concha Se, MD 04/27/22 1256

## 2022-04-27 NOTE — ED Triage Notes (Signed)
Pt reports is [redacted] weeks pregnant with twins and this am started with vaginal bleeding and abd cramping. Pt reports is seen at [facility] but they were referring to a specialist due to twins.

## 2022-04-28 LAB — URINE CULTURE: Culture: NO GROWTH

## 2022-06-18 ENCOUNTER — Ambulatory Visit
Admission: EM | Admit: 2022-06-18 | Discharge: 2022-06-18 | Disposition: A | Discharge status: Home / Self Care | Payer: Medicaid Other | Attending: Emergency Medicine | Admitting: Emergency Medicine

## 2022-06-18 DIAGNOSIS — Z20822 Contact with and (suspected) exposure to covid-19: Secondary | ICD-10-CM | POA: Diagnosis not present

## 2022-06-18 DIAGNOSIS — J069 Acute upper respiratory infection, unspecified: Secondary | ICD-10-CM | POA: Diagnosis not present

## 2022-06-18 LAB — GROUP A STREP BY PCR: Group A Strep by PCR: NOT DETECTED

## 2022-06-18 LAB — SARS CORONAVIRUS 2 BY RT PCR: SARS Coronavirus 2 by RT PCR: NEGATIVE

## 2022-06-18 NOTE — Discharge Instructions (Addendum)
Your test today was negative for both COVID and strep.  I believe you have a viral respiratory infection.  Use over-the-counter Tylenol according to package instructions as needed for fever or pain.  You may gargle with warm salt water, 1 tablespoon of table salt in 8 ounces of warm water, gargle and spit as often as needed to soothe your throat.  You may also use over-the-counter Chloraseptic or Sucrets lozenges to help with sore throat pain.  Sinus irrigation can also be helpful in eliminating some of your congestion.  Use a NeilMed sinus rinse kit with distilled water.  Do not use tap water.  You may use this as often as needed to alleviate nasal congestion.  You can use plain over-the-counter Robitussin according to the package instructions as needed for cough and congestion.  Any continued or worsening symptoms I recommend you follow-up with your maternal-fetal medicine specialist.

## 2022-06-18 NOTE — ED Provider Notes (Signed)
[facility]    CSN: [id number] Arrival date & time: 06/18/22  1017      History   Chief Complaint Chief Complaint  Patient presents with   Cough   Sore Throat    HPI Destiny [name] is a 21 y.o. female.   HPI  21 year old female here for evaluation of respiratory complaints.  Patient reports that the Lasix days that she has been experiencing sore throat with painful swallowing, sinus pressure with a whitish nasal discharge, and a cough that is productive for a white sputum.  She denies any fever, ear pain, shortness of breath or wheezing, nausea, or vomiting.  She does endorse some mild diarrhea.  Patient is currently 3 months pregnant and she is followed by [facility] maternal-fetal medicine due to having a twin pregnancy.  This was confirmed by ultrasound on 04/27/2022.  She has not notified her OB of her illness.  Patient also has a significant past medical history of MDD, substance-induced mood disorder, ODD, and alcohol abuse.  High risk for self-harm.  Patient exhibits normal thought process and normal mood.  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   At high risk for self harm 11/01/2018   MDD (major depressive disorder), recurrent severe, without psychosis ([place]) 11/01/2018   Moderate major depression, single episode ([place]) 09/22/2018   Alcohol abuse 09/22/2018   Substance induced mood disorder ([place]) 09/22/2018    Past Surgical History:  Procedure Laterality Date   TONSILLECTOMY      OB History     Gravida  1   Para  0   Term  0   Preterm  0   AB  0   Living  0      SAB  0   IAB  0   Ectopic  0   Multiple  0   Live Births  0            Home Medications    Prior to Admission medications   Medication Sig Start Date End Date Taking?  Authorizing Provider  Prenatal Vit-Fe Fumarate-FA (MULTIVITAMIN-PRENATAL) 27-0.8 MG TABS tablet Take 1 tablet by mouth daily at 12 noon. 04/09/22 07/18/22 Yes Caren Macadam, MD  amoxicillin (AMOXIL) 875 MG tablet Take 1 tablet (875 mg total) by mouth 2 (two) times daily. Patient not taking: Reported on 04/09/2022 09/15/21   Versie Starks, PA-C  benzoyl peroxide-erythromycin [facility]) gel Apply topically 2 (two) times daily. Patient not taking: Reported on 04/09/2022 08/02/19   McVey, Gelene Mink, PA-C  EPINEPHrine 0.3 mg/0.3 mL IJ SOAJ injection Inject 0.3 mg into the muscle as needed for anaphylaxis. 11/21/21   Rudene Re, MD  etonogestrel (NEXPLANON) 68 MG IMPL implant 1 each by Subdermal route once. Patient not taking: Reported on 04/09/2022    [provider]  sertraline (ZOLOFT) 50 MG tablet Take 1 tablet (50 mg total) by mouth daily. Patient not taking: Reported on 04/09/2022 11/02/18   Clovis Fredrickson, MD    Family History History reviewed. No pertinent family history.  Social History Social History   Tobacco Use   Smoking status: Never   Smokeless tobacco: Never  Vaping Use   Vaping Use: Every day   Substances: Nicotine, Flavoring  Substance Use Topics   Alcohol  use: Not Currently    Comment: last use 02/2022   Drug use: Not Currently    Types: Marijuana    Comment: last use "years ago"     Allergies   Ambien [zolpidem], Lorazepam, Lorazepam, Other, Zofran [ondansetron hcl], Zofran [ondansetron], and Zolpidem   Review of Systems Review of Systems  Constitutional:  Negative for fever.  HENT:  Positive for congestion, sinus pressure and sore throat. Negative for ear pain.   Respiratory:  Positive for cough. Negative for shortness of breath and wheezing.   Gastrointestinal:  Positive for diarrhea. Negative for nausea and vomiting.     Physical Exam Triage Vital Signs ED Triage Vitals  Enc Vitals Group     BP 06/18/22 1048  115/77     Pulse Rate 06/18/22 1048 76     Resp --      Temp 06/18/22 1048 98.2 F (36.8 C)     Temp Source 06/18/22 1048 Oral     SpO2 06/18/22 1048 99 %     Weight 06/18/22 1045 198 lb (89.8 kg)     Height 06/18/22 1045 '5\' 5"'  (1.651 m)     Head Circumference --      Peak Flow --      Pain Score 06/18/22 1044 4     Pain Loc --      Pain Edu? --      Excl. in [place]? --    No data found.  Updated Vital Signs BP 115/77 (BP Location: Left Arm)   Pulse 76   Temp 98.2 F (36.8 C) (Oral)   Ht '5\' 5"'  (1.651 m)   Wt 198 lb (89.8 kg)   LMP 03/01/2022 (Exact Date)   SpO2 99%   BMI 32.95 kg/m   Visual Acuity Right Eye Distance:   Left Eye Distance:   Bilateral Distance:    Right Eye Near:   Left Eye Near:    Bilateral Near:     Physical Exam Vitals and nursing note reviewed.  Constitutional:      Appearance: Normal appearance. She is not ill-appearing.  HENT:     Head: Normocephalic and atraumatic.     Right Ear: Tympanic membrane, ear canal and external ear normal. There is no impacted cerumen.     Left Ear: Tympanic membrane, ear canal and external ear normal. There is no impacted cerumen.     Nose: Congestion and rhinorrhea present.     Mouth/Throat:     Mouth: Mucous membranes are moist.     Pharynx: Oropharynx is clear. Posterior oropharyngeal erythema present. No oropharyngeal exudate.  Cardiovascular:     Rate and Rhythm: Normal rate and regular rhythm.     Pulses: Normal pulses.     Heart sounds: Normal heart sounds. No murmur heard.    No friction rub. No gallop.  Pulmonary:     Effort: Pulmonary effort is normal.     Breath sounds: Normal breath sounds. No wheezing, rhonchi or rales.  Musculoskeletal:     Cervical back: Normal range of motion and neck supple.  Lymphadenopathy:     Cervical: No cervical adenopathy.  Skin:    General: Skin is warm and dry.     Capillary Refill: Capillary refill takes less than 2 seconds.     Findings: No erythema or rash.   Neurological:     General: No focal deficit present.     Mental Status: She is alert and oriented to person, place, and time.  Psychiatric:  Mood and Affect: Mood normal.        Behavior: Behavior normal.        Thought Content: Thought content normal.        Judgment: Judgment normal.      UC Treatments / Results  Labs (all labs ordered are listed, but only abnormal results are displayed) Labs Reviewed  SARS CORONAVIRUS 2 BY RT PCR  GROUP A STREP BY PCR    EKG   Radiology No results found.  Procedures Procedures (including critical care time)  Medications Ordered in UC Medications - No data to display  Initial Impression / Assessment and Plan / UC Course  I have reviewed the triage vital signs and the nursing notes.  Pertinent labs & imaging results that were available during my care of the patient were reviewed by me and considered in my medical decision making (see chart for details).   Patient is a pleasant, nontoxic-appearing 21 year old female who is currently 3 months pregnant with twins presenting for evaluation of respiratory symptoms as outlined in HPI above.  On exam patient has pearly-gray tympanic membranes bilaterally with normal reflex and clear external auditory canals.  Nasal mucosa is markedly edematous and erythematous with scant clear discharge in both nares.  Oropharyngeal exam reveals posterior oropharyngeal erythema and injection.  No exudate appreciated on exam.  No anterior cervical lymphadenopathy on exam.  Cardiopulmonary exam reveals clear lung sounds in all fields.  Patient's exam is consistent with a respiratory infection.  I will swab patient for COVID and I will also swab patient for strep.  Because patient is pregnant with twins I have advised her that if she test positive for COVID, even though she has been on the typical 5-day quarantine, to contact her maternal-fetal medicine specialist to advise them that she is positive.  They may  choose to do an infusion despite the length of time that is lapsed.  If patient strep is positive I will treat her with amoxicillin.  Strep PCR is negative.  COVID PCR is negative.  I will discharge patient home with a diagnosis of viral URI with a cough.  Tylenol as needed for pain, sinus irrigation, salt water gargles, over-the-counter Chloraseptic Sucrets lozenges, and over-the-counter plain Robitussin as needed for cough.   Final Clinical Impressions(s) / UC Diagnoses   Final diagnoses:  Viral URI with cough     Discharge Instructions      Your test today was negative for both COVID and strep.  I believe you have a viral respiratory infection.  Use over-the-counter Tylenol according to package instructions as needed for fever or pain.  You may gargle with warm salt water, 1 tablespoon of table salt in 8 ounces of warm water, gargle and spit as often as needed to soothe your throat.  You may also use over-the-counter Chloraseptic or Sucrets lozenges to help with sore throat pain.  Sinus irrigation can also be helpful in eliminating some of your congestion.  Use a NeilMed sinus rinse kit with distilled water.  Do not use tap water.  You may use this as often as needed to alleviate nasal congestion.  You can use plain over-the-counter Robitussin according to the package instructions as needed for cough and congestion.  Any continued or worsening symptoms I recommend you follow-up with your maternal-fetal medicine specialist.     ED Prescriptions   None    PDMP not reviewed this encounter.   Margarette Canada, NP 06/18/22 1141

## 2022-06-18 NOTE — ED Triage Notes (Addendum)
Pt c/o sore throat, itchy, sinuses feel stopped up, chest congestion, cough onset, pt states does hurt to swallow onset x6 days ago. Pt states she is pregnant so has not been taking anything for sxs

## 2023-08-14 ENCOUNTER — Other Ambulatory Visit: Payer: Self-pay

## 2023-08-14 ENCOUNTER — Encounter: Payer: Self-pay | Admitting: Emergency Medicine

## 2023-08-14 ENCOUNTER — Emergency Department
Admission: EM | Admit: 2023-08-14 | Discharge: 2023-08-14 | Disposition: A | Discharge status: Home / Self Care | Payer: Medicaid Other | Attending: Emergency Medicine | Admitting: Emergency Medicine

## 2023-08-14 DIAGNOSIS — R112 Nausea with vomiting, unspecified: Secondary | ICD-10-CM | POA: Insufficient documentation

## 2023-08-14 DIAGNOSIS — E860 Dehydration: Secondary | ICD-10-CM | POA: Diagnosis not present

## 2023-08-14 DIAGNOSIS — E876 Hypokalemia: Secondary | ICD-10-CM | POA: Diagnosis not present

## 2023-08-14 DIAGNOSIS — R197 Diarrhea, unspecified: Secondary | ICD-10-CM | POA: Diagnosis not present

## 2023-08-14 LAB — LIPASE, BLOOD: Lipase: 26 U/L (ref 11–51)

## 2023-08-14 LAB — URINALYSIS, ROUTINE W REFLEX MICROSCOPIC
Bilirubin Urine: NEGATIVE
Glucose, UA: NEGATIVE mg/dL
Ketones, ur: NEGATIVE mg/dL
Leukocytes,Ua: NEGATIVE
Nitrite: NEGATIVE
Protein, ur: >=300 mg/dL — AB
Specific Gravity, Urine: 1.03 (ref 1.005–1.030)
pH: 5 (ref 5.0–8.0)

## 2023-08-14 LAB — COMPREHENSIVE METABOLIC PANEL
ALT: 22 U/L (ref 0–44)
AST: 23 U/L (ref 15–41)
Albumin: 4.3 g/dL (ref 3.5–5.0)
Alkaline Phosphatase: 106 U/L (ref 38–126)
Anion gap: 13 (ref 5–15)
BUN: 10 mg/dL (ref 6–20)
CO2: 18 mmol/L — ABNORMAL LOW (ref 22–32)
Calcium: 9.2 mg/dL (ref 8.9–10.3)
Chloride: 105 mmol/L (ref 98–111)
Creatinine, Ser: 0.77 mg/dL (ref 0.44–1.00)
GFR, Estimated: >60 mL/min (ref >60)
Glucose, Bld: 124 mg/dL — ABNORMAL HIGH (ref 70–99)
Potassium: 3.1 mmol/L — ABNORMAL LOW (ref 3.5–5.1)
Sodium: 136 mmol/L (ref 135–145)
Total Bilirubin: 0.6 mg/dL (ref <1.2)
Total Protein: 8.1 g/dL (ref 6.5–8.1)

## 2023-08-14 LAB — CBC
HCT: 46.5 % — ABNORMAL HIGH (ref 36.0–46.0)
Hemoglobin: 15.4 g/dL — ABNORMAL HIGH (ref 12.0–15.0)
MCH: 27.5 pg (ref 26.0–34.0)
MCHC: 33.1 g/dL (ref 30.0–36.0)
MCV: 83.2 fL (ref 80.0–100.0)
Platelets: 431 10*3/uL — ABNORMAL HIGH (ref 150–400)
RBC: 5.59 MIL/uL — ABNORMAL HIGH (ref 3.87–5.11)
RDW: 13.6 % (ref 11.5–15.5)
WBC: 11.2 10*3/uL — ABNORMAL HIGH (ref 4.0–10.5)
nRBC: 0 % (ref 0.0–0.2)

## 2023-08-14 LAB — POC URINE PREG, ED: Preg Test, Ur: NEGATIVE

## 2023-08-14 MED ORDER — METOCLOPRAMIDE HCL 5 MG/ML IJ SOLN
10.0000 mg | Freq: Once | INTRAMUSCULAR | Status: AC
  Administered 2023-08-14: 10 mg via INTRAVENOUS
  Filled 2023-08-14: qty 2

## 2023-08-14 MED ORDER — POTASSIUM CHLORIDE CRYS ER 20 MEQ PO TBCR
40.0000 meq | EXTENDED_RELEASE_TABLET | Freq: Once | ORAL | Status: AC
  Administered 2023-08-14: 40 meq via ORAL
  Filled 2023-08-14: qty 2

## 2023-08-14 MED ORDER — METOCLOPRAMIDE HCL 10 MG PO TABS: 10.0000 mg | ORAL_TABLET | Freq: Two times a day (BID) | ORAL | 0 refills | Status: DC | PRN

## 2023-08-14 MED ORDER — SODIUM CHLORIDE 0.9 % IV BOLUS
1000.0000 mL | Freq: Once | INTRAVENOUS | Status: AC
  Administered 2023-08-14: 1000 mL via INTRAVENOUS

## 2023-08-14 MED ORDER — DIPHENHYDRAMINE HCL 50 MG/ML IJ SOLN
12.5000 mg | Freq: Once | INTRAMUSCULAR | Status: AC
  Administered 2023-08-14: 12.5 mg via INTRAVENOUS
  Filled 2023-08-14: qty 1

## 2023-08-14 NOTE — ED Triage Notes (Signed)
Pt here with emesis. Pt states family has a stomach bug. Pt vomited twice since yesterday and passed out a few times. Pt denies abd pain today but had some yesterday. Pt states the last time she had these symptoms she had a cyst on her ovaries. Pt alert and oriented.     104/73 103 96% RA

## 2023-08-14 NOTE — Discharge Instructions (Addendum)
Drink plenty of fluids to stay well-hydrated.  Find Pedialyte or similar electrolyte rehydration formulas at your local pharmacy. Take reglan as needed for nausea.   Thank you for choosing [place] for your health care today!  Please see your primary doctor this week for a follow up appointment.   If you have any new, worsening, or unexpected symptoms call your doctor right away or come back to the emergency department for reevaluation.  It was my pleasure to care for you today.   Daneil Dan [place] Charon, MD

## 2023-08-14 NOTE — ED Notes (Signed)
Patient given a warm blanket.

## 2023-08-14 NOTE — ED Provider Notes (Signed)
[facility] Provider Note    Event Date/Time   First MD Initiated Contact with Patient 08/14/23 1117     (approximate)   History   Emesis   HPI  Destiny [name] is a 22 y.o. female   Past medical history of no significant past medical history presents emerged apartment with 2 days of nausea vomiting diarrhea.  No GI bleeding.  No fever.  Crampy abdominal pain.  No urinary symptoms.  Her children and spouse have the same symptoms.  Independent Historian contributed to assessment above: Spouse at bedside corroborates information as above       Physical Exam   Triage Vital Signs: ED Triage Vitals [08/14/23 1106]  Encounter Vitals Group     BP 111/84     Systolic BP Percentile      Diastolic BP Percentile      Pulse Rate (!) 113     Resp 16     Temp 97.8 F (36.6 C)     Temp Source Oral     SpO2 98 %     Weight 197 lb 15.6 oz (89.8 kg)     Height 5\' 5"  (1.651 m)     Head Circumference      Peak Flow      Pain Score 0     Pain Loc      Pain Education      Exclude from Growth Chart     Most recent vital signs: Vitals:   08/14/23 1106  BP: 111/84  Pulse: (!) 113  Resp: 16  Temp: 97.8 F (36.6 C)  SpO2: 98%    General: Awake, no distress.  CV:  Good peripheral perfusion.  Resp:  Normal effort.  Abd:  No distention.  Other:  Awake alert comfortable appearing mildly tachycardic, mucous membranes dry appears slightly dehydrated.  Soft nontender abdomen deep palpation all quadrants.   ED Results / Procedures / Treatments   Labs (all labs ordered are listed, but only abnormal results are displayed) Labs Reviewed  COMPREHENSIVE METABOLIC PANEL - Abnormal; Notable for the following components:      Result Value   Potassium 3.1 (*)    CO2 18 (*)    Glucose, Bld 124 (*)    All other components within normal limits  CBC - Abnormal; Notable for the following components:   WBC 11.2 (*)    RBC 5.59 (*)    Hemoglobin 15.4 (*)    HCT  46.5 (*)    Platelets 431 (*)    All other components within normal limits  URINALYSIS, ROUTINE W REFLEX MICROSCOPIC - Abnormal; Notable for the following components:   Color, Urine AMBER (*)    APPearance HAZY (*)    Hgb urine dipstick SMALL (*)    Protein, ur >=300 (*)    Bacteria, UA RARE (*)    All other components within normal limits  LIPASE, BLOOD  POC URINE PREG, ED     I ordered and reviewed the above labs they are notable for electrolytes significant for mild hypokalemia.  Cell counts appear slightly hemoconcentrated compared to prior testing  PROCEDURES:  Critical Care performed: No  Procedures   MEDICATIONS ORDERED IN ED: Medications  potassium chloride SA (KLOR-CON M) CR tablet 40 mEq (has no administration in time range)  sodium chloride 0.9 % bolus 1,000 mL (has no administration in time range)  metoCLOPramide (REGLAN) injection 10 mg (has no administration in time range)  diphenhydrAMINE (BENADRYL) injection 12.5 mg (  has no administration in time range)     IMPRESSION / MDM / ASSESSMENT AND PLAN / ED COURSE  I reviewed the triage vital signs and the nursing notes.                                Patient's presentation is most consistent with acute presentation with potential threat to life or bodily function.  Differential diagnosis includes, but is not limited to, viral gastroenteritis, dehydration, electrolyte disturbance, considered but less likely surgical abdominal pathologies like appendicitis or cholecystitis   The patient is on the cardiac monitor to evaluate for evidence of arrhythmia and/or significant heart rate changes.  MDM:    Nausea vomiting diarrhea consistent with viral gastroenteritis in this young healthy patient who looks slightly dehydrated.  Will hydrate with IV fluids, antiemetic avoid Zofran given hive allergy, give Reglan instead.  Soft benign abdominal exam rules against surgical abdominal pathologies at this time.  Replete  mildly low potassium.  Anticipate discharge.       FINAL CLINICAL IMPRESSION(S) / ED DIAGNOSES   Final diagnoses:  Nausea vomiting and diarrhea  Dehydration  Hypokalemia     Rx / DC Orders   ED Discharge Orders          Ordered    metoCLOPramide (REGLAN) 10 MG tablet  2 times daily PRN        08/14/23 1141             Note:  This document was prepared using Dragon voice recognition software and may include unintentional dictation errors.    Pilar Jarvis, MD 08/14/23 [phone]

## 2024-01-16 ENCOUNTER — Ambulatory Visit: Admission: EM | Admit: 2024-01-16 | Discharge: 2024-01-16 | Disposition: A | Discharge status: Home / Self Care

## 2024-01-16 ENCOUNTER — Encounter: Payer: Self-pay | Admitting: Emergency Medicine

## 2024-01-16 DIAGNOSIS — M79671 Pain in right foot: Secondary | ICD-10-CM | POA: Diagnosis not present

## 2024-01-16 DIAGNOSIS — B070 Plantar wart: Secondary | ICD-10-CM | POA: Diagnosis not present

## 2024-01-16 MED ORDER — SALICYLIC ACID 17 % EX LIQD: Freq: Every day | CUTANEOUS | 2 refills | Status: DC

## 2024-01-16 NOTE — ED Triage Notes (Signed)
 Patient reports tender bumps on the sole of her right foot for a week.  Patient denies injury or fall.

## 2024-01-16 NOTE — ED Provider Notes (Signed)
 [facility]    CSN: [id number] Arrival date & time: 01/16/24  1051      History   Chief Complaint Chief Complaint  Patient presents with   Foot Pain    right    HPI Destiny [name] is a 23 y.o. female presenting for painful bumps on her right foot for the past few weeks. She thought they were calluses. Denies injury. Increased pain with weightbearing. No previous diagnosis of plantar warts. No OTC treatments attempted.    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 Authorizing Provider  FLUoxetine (PROZAC) 40 MG capsule  03/04/23  Yes [provider]  salicylic acid-lactic acid 17 % external solution Apply topically daily. Apply to warts topically daily 01/16/24  Yes Floydene Hy, PA-C  buPROPion (WELLBUTRIN SR) 150 MG 12 hr tablet     [provider]  EPINEPHrine  0.3 mg/0.3 mL IJ SOAJ injection Inject 0.3 mg into the muscle as needed for anaphylaxis. 11/21/21   Isa Manuel, MD  etonogestrel (NEXPLANON) 68 MG IMPL implant 1 each by Subdermal route once. Patient not taking: Reported on 04/09/2022    [provider]   metoCLOPramide  (REGLAN ) 10 MG tablet Take 1 tablet (10 mg total) by mouth 2 (two) times daily as needed for up to 20 doses for nausea or vomiting. 08/14/23   Buell Carmin, MD  sertraline  (ZOLOFT ) 50 MG tablet Take 1 tablet (50 mg total) by mouth daily. Patient not taking: Reported on 04/09/2022 11/02/18   Pucilowska, Jolanta B, MD    Family History History reviewed. No pertinent family history.  Social History Social History   Tobacco Use   Smoking status: Never   Smokeless tobacco: Never  Vaping Use   Vaping status: Every Day   Substances: Nicotine, Flavoring  Substance Use Topics   Alcohol use: Not Currently    Comment: last use 02/2022   Drug use: Not Currently    Types: Marijuana    Comment: last use "years ago"     Allergies   Ambien [zolpidem], Lorazepam , Lorazepam , Other, Zofran  [ondansetron  hcl], Zofran  [ondansetron ], and Zolpidem   Review of Systems Review of Systems  Musculoskeletal:  Positive for arthralgias. Negative for joint swelling.  Skin:  Negative for color change, rash and wound.  Neurological:  Negative for weakness and numbness.     Physical Exam Triage Vital Signs  ED Triage Vitals  Encounter Vitals Group     BP 01/16/24 1119 111/74     Systolic BP Percentile --      Diastolic BP Percentile --      Pulse Rate 01/16/24 1119 99     Resp 01/16/24 1119 14     Temp 01/16/24 1119 98.3 F (36.8 C)     Temp Source 01/16/24 1119 Oral     SpO2 01/16/24 1119 99 %     Weight 01/16/24 1116 197 lb 15.6 oz (89.8 kg)     Height 01/16/24 1116 5\' 5"  (1.651 m)     Head Circumference --      Peak Flow --      Pain Score 01/16/24 1116 8     Pain Loc --      Pain Education --      Exclude from Growth Chart --    No data found.  Updated Vital Signs BP 111/74 (BP Location: Right Arm)   Pulse 99   Temp 98.3 F (36.8 C) (Oral)   Resp 14   Ht 5\' 5"  (1.651 m)   Wt 197 lb 15.6 oz (89.8 kg)   LMP 01/09/2024   SpO2 99%   Breastfeeding No   BMI 32.94 kg/m       Physical Exam Vitals and nursing note reviewed.  Constitutional:      General: She is not in acute distress.    Appearance: Normal appearance. She is not ill-appearing or toxic-appearing.  HENT:     Head: Normocephalic and atraumatic.  Eyes:     General: No scleral icterus.       Right eye: No discharge.        Left eye: No discharge.     Conjunctiva/sclera: Conjunctivae normal.  Cardiovascular:     Rate and Rhythm: Normal rate.  Pulmonary:     Effort: Pulmonary effort is normal. No respiratory distress.  Musculoskeletal:     Cervical back: Neck supple.  Skin:    General: Skin is dry.     Comments: RIGHT FOOT: There are 4 plantar warts present with tenderness   Neurological:     General: No focal deficit present.     Mental Status: She is alert. Mental status is at baseline.     Motor: No weakness.     Gait: Gait normal.  Psychiatric:        Mood and Affect: Mood normal.        Behavior: Behavior normal.      UC Treatments / Results  Labs (all labs ordered are listed, but only abnormal results are displayed) Labs Reviewed - No data to display  EKG   Radiology No results found.  Procedures Procedures (including critical care time)  Medications Ordered in UC Medications - No data to display  Initial Impression / Assessment and Plan / UC Course  I have reviewed the triage vital signs and the nursing notes.  Pertinent labs & imaging results that were available during my care of the patient were reviewed by me and considered in my medical decision making (see chart for details).   23 year old female presents for painful bumps on the right plantar foot for the past couple of weeks.  Presentation is consistent plantar warts.  Sent prescription for salicylic acid solution and discussed care at home.  Referral placed to podiatry for further evaluation and potential consideration of cryotherapy.   Final Clinical Impressions(s) / UC Diagnoses   Final  diagnoses:  Plantar  warts  Foot pain, right     Discharge Instructions      -You have plantar warts -Topical treatments require 2-3 months for eradication -Relieve pressure on wart area with pads -Soak foot before application of salicylic acid -Soak in warm water for 15-30 minutes -Apply Mediplast or Occlusal to lesion overnight -Remove in morning and use a loofa pad/pumice stone -Repeat as often as nightly -Referral placed to podiatry    ED Prescriptions     Medication Sig Dispense Auth. Provider   salicylic acid-lactic acid 17 % external solution Apply topically daily. Apply to warts topically daily 15 mL Floydene Hy, PA-C      PDMP not reviewed this encounter.   Floydene Hy, PA-C 01/16/24 1322

## 2024-01-16 NOTE — Discharge Instructions (Addendum)
-  You have plantar warts -Topical treatments require 2-3 months for eradication -Relieve pressure on wart area with pads -Soak foot before application of salicylic acid -Soak in warm water for 15-30 minutes -Apply Mediplast or Occlusal to lesion overnight -Remove in morning and use a loofa pad/pumice stone -Repeat as often as nightly -Referral placed to podiatry

## 2024-02-05 ENCOUNTER — Ambulatory Visit: Admitting: Podiatry

## 2024-02-05 ENCOUNTER — Encounter: Payer: Self-pay | Admitting: Podiatry

## 2024-02-05 DIAGNOSIS — B070 Plantar wart: Secondary | ICD-10-CM

## 2024-02-05 NOTE — Progress Notes (Signed)
   Chief Complaint  Patient presents with   Callouses    "I have Plantar Warts on the bottom of my foot." N - warts L - right plantar 1st met, 5th met, heel D - 4 mos O - suddenly, progressively worse C - tender, sore, ache sometime A - walking T - pumice stone, Urgent Care - Salicylic Acid , Padding, Soak foot and scrub with stone    Subjective: 23 y.o. female presenting today as a new patient for evaluation of pain and tenderness associated to symptomatic warts to the plantar aspect of the right foot.  Ongoing for several months.  She states that she got the warts when she went to [facility] and was at the swimming poles barefoot.  Presenting for further treatment evaluation   Past Medical History:  Diagnosis Date   Asthma    Insomnia    Ovarian cyst    Past Surgical History:  Procedure Laterality Date   TONSILLECTOMY     Allergies  Allergen Reactions   Ambien [Zolpidem] Other (See Comments)    AMS   Lorazepam  Other (See Comments)    Feels funny   Lorazepam  Hives   Other Hives and Itching    Tegaderm   Zofran  [Ondansetron  Hcl] Hives and Itching   Zofran  [Ondansetron ] Hives   Zolpidem Other (See Comments)    Feels funny    Objective: Physical Exam General: The patient is alert and oriented x3 in no acute distress.   Dermatology: Hyperkeratotic skin lesion(s) noted to the plantar aspect of the right foot approximately 1 cm in diameter noted to the plantar aspect of the first MTP, fifth MTP, and heel right foot. Pinpoint bleeding noted upon debridement. Skin is warm, dry and supple bilateral lower extremities. Negative for open lesions or macerations.   Vascular: Palpable pedal pulses bilaterally. No edema or erythema noted. Capillary refill within normal limits.   Neurological: Grossly intact via light touch   Musculoskeletal Exam: Pain on palpation to the noted skin lesion(s).  Range of motion within normal limits to all pedal and ankle joints bilateral.  Muscle strength 5/5 in all groups bilateral.    Assessment: #1 plantar wart right foot x 3 #2 pain in right foot     Plan of Care:  -Patient was evaluated. -Excisional debridement of the plantar wart lesion(s) was performed using a chisel blade. Cantharone was applied and the lesion(s) was dressed with a dry sterile dressing. 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 Dot Gazella, DPM    [address]                                        [place], [place] [address]                Office [phone]  Fax [phone]

## 2024-03-04 ENCOUNTER — Ambulatory Visit: Admitting: Podiatry

## 2024-03-14 ENCOUNTER — Other Ambulatory Visit: Payer: Self-pay

## 2024-03-14 ENCOUNTER — Emergency Department

## 2024-03-14 ENCOUNTER — Emergency Department
Admission: EM | Admit: 2024-03-14 | Discharge: 2024-03-15 | Disposition: A | Discharge status: Home / Self Care | Attending: Emergency Medicine | Admitting: Emergency Medicine

## 2024-03-14 DIAGNOSIS — T7491XA Unspecified adult maltreatment, confirmed, initial encounter: Secondary | ICD-10-CM

## 2024-03-14 DIAGNOSIS — T71194A Asphyxiation due to mechanical threat to breathing due to other causes, undetermined, initial encounter: Secondary | ICD-10-CM

## 2024-03-14 DIAGNOSIS — D72829 Elevated white blood cell count, unspecified: Secondary | ICD-10-CM | POA: Insufficient documentation

## 2024-03-14 DIAGNOSIS — J45909 Unspecified asthma, uncomplicated: Secondary | ICD-10-CM | POA: Insufficient documentation

## 2024-03-14 DIAGNOSIS — F430 Acute stress reaction: Secondary | ICD-10-CM

## 2024-03-14 DIAGNOSIS — T7411XA Adult physical abuse, confirmed, initial encounter: Secondary | ICD-10-CM | POA: Diagnosis not present

## 2024-03-14 DIAGNOSIS — F331 Major depressive disorder, recurrent, moderate: Secondary | ICD-10-CM | POA: Diagnosis not present

## 2024-03-14 LAB — COMPREHENSIVE METABOLIC PANEL WITH GFR
ALT: 26 U/L (ref 0–44)
AST: 25 U/L (ref 15–41)
Albumin: 4.4 g/dL (ref 3.5–5.0)
Alkaline Phosphatase: 97 U/L (ref 38–126)
Anion gap: 11 (ref 5–15)
BUN: 9 mg/dL (ref 6–20)
CO2: 23 mmol/L (ref 22–32)
Calcium: 8.7 mg/dL — ABNORMAL LOW (ref 8.9–10.3)
Chloride: 105 mmol/L (ref 98–111)
Creatinine, Ser: 0.58 mg/dL (ref 0.44–1.00)
GFR, Estimated: >60 mL/min (ref >60)
Glucose, Bld: 103 mg/dL — ABNORMAL HIGH (ref 70–99)
Potassium: 3.5 mmol/L (ref 3.5–5.1)
Sodium: 139 mmol/L (ref 135–145)
Total Bilirubin: 0.5 mg/dL (ref 0.0–1.2)
Total Protein: 7.9 g/dL (ref 6.5–8.1)

## 2024-03-14 LAB — CBC
HCT: 42.2 % (ref 36.0–46.0)
Hemoglobin: 14.1 g/dL (ref 12.0–15.0)
MCH: 28.5 pg (ref 26.0–34.0)
MCHC: 33.4 g/dL (ref 30.0–36.0)
MCV: 85.3 fL (ref 80.0–100.0)
Platelets: 334 10*3/uL (ref 150–400)
RBC: 4.95 MIL/uL (ref 3.87–5.11)
RDW: 14.1 % (ref 11.5–15.5)
WBC: 13.9 10*3/uL — ABNORMAL HIGH (ref 4.0–10.5)
nRBC: 0 % (ref 0.0–0.2)

## 2024-03-14 LAB — SALICYLATE LEVEL: Salicylate Lvl: <7 mg/dL — ABNORMAL LOW (ref 7.0–30.0)

## 2024-03-14 LAB — ACETAMINOPHEN LEVEL: Acetaminophen (Tylenol), Serum: <10 ug/mL — ABNORMAL LOW (ref 10–30)

## 2024-03-14 LAB — ETHANOL: Alcohol, Ethyl (B): <15 mg/dL (ref <15)

## 2024-03-14 MED ORDER — IOHEXOL 350 MG/ML SOLN
75.0000 mL | Freq: Once | INTRAVENOUS | Status: AC | PRN
  Administered 2024-03-14: 75 mL via INTRAVENOUS

## 2024-03-14 NOTE — ED Notes (Signed)
 Snack given to this pt. Pt calm and cooperative.

## 2024-03-14 NOTE — ED Notes (Addendum)
 Pt belongings:  Blue tshirt [employer] Black socks Blue crocs Pink hair tie Black bra Pink panties Black phone

## 2024-03-14 NOTE — ED Notes (Signed)
 Tech gave this pt water.

## 2024-03-14 NOTE — ED Notes (Signed)
vol/psych consult ordered/pending.. 
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
## 2024-03-15 ENCOUNTER — Encounter: Payer: Self-pay | Admitting: Psychiatry

## 2024-03-15 DIAGNOSIS — T7491XA Unspecified adult maltreatment, confirmed, initial encounter: Secondary | ICD-10-CM

## 2024-03-15 DIAGNOSIS — F331 Major depressive disorder, recurrent, moderate: Secondary | ICD-10-CM

## 2024-03-15 DIAGNOSIS — F430 Acute stress reaction: Secondary | ICD-10-CM

## 2024-03-15 MED ORDER — HYDROXYZINE HCL 25 MG PO TABS: 25.0000 mg | ORAL_TABLET | Freq: Three times a day (TID) | ORAL | 1 refills | Status: AC | PRN

## 2024-03-15 NOTE — ED Notes (Signed)
 Cart placed in room. Pt made aware of process.

## 2024-03-15 NOTE — ED Notes (Signed)
 Belongings returned to pt

## 2024-03-15 NOTE — ED Provider Notes (Addendum)
-----------------------------------------   11:17 AM on 03/15/2024 ----------------------------------------- Patient has been seen and evaluated by psychiatry.  It appears that they believe the patient safe for discharge home from their standpoint.  No concerning findings on the medical workup.  Patient is suffering from anxiety.  Will prescribe hydroxyzine  to be used as needed.  Patient will follow-up with outpatient resources.  Patient is requesting to be discharged home, we will comply.  I have personally seen and evaluated the patient she believes she is safe for discharge I agree.   Dorothyann Drivers, MD 03/15/24 1117    Dorothyann Drivers, MD 03/15/24 [phone]

## 2024-03-15 NOTE — ED Notes (Deleted)
Pt's belongings were given back to pt. 

## 2024-03-15 NOTE — ED Notes (Signed)
 Pt Breakfast provided at bedside
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
## 2024-04-26 ENCOUNTER — Ambulatory Visit
Admission: EM | Admit: 2024-04-26 | Discharge: 2024-04-26 | Disposition: A | Discharge status: Home / Self Care | Payer: Self-pay | Attending: Emergency Medicine | Admitting: Emergency Medicine

## 2024-04-26 DIAGNOSIS — J069 Acute upper respiratory infection, unspecified: Secondary | ICD-10-CM

## 2024-04-26 DIAGNOSIS — H66002 Acute suppurative otitis media without spontaneous rupture of ear drum, left ear: Secondary | ICD-10-CM

## 2024-04-26 DIAGNOSIS — J029 Acute pharyngitis, unspecified: Secondary | ICD-10-CM

## 2024-04-26 MED ORDER — IPRATROPIUM BROMIDE 0.06 % NA SOLN: 2.0000 | Freq: Four times a day (QID) | NASAL | 12 refills | Status: DC

## 2024-04-26 MED ORDER — AMOXICILLIN-POT CLAVULANATE 875-125 MG PO TABS: 1.0000 | ORAL_TABLET | Freq: Two times a day (BID) | ORAL | 0 refills | Status: AC

## 2024-04-26 MED ORDER — PROMETHAZINE-DM 6.25-15 MG/5ML PO SYRP: 5.0000 mL | ORAL_SOLUTION | Freq: Four times a day (QID) | ORAL | 0 refills | Status: DC | PRN

## 2024-04-26 MED ORDER — BENZONATATE 100 MG PO CAPS: 200.0000 mg | ORAL_CAPSULE | Freq: Three times a day (TID) | ORAL | 0 refills | Status: DC
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
## 2024-04-26 NOTE — Discharge Instructions (Addendum)
 The Augmentin  twice daily with food for 10 days for treatment of your URI, ear infection, and sore throat.  Perform sinus irrigation 2-3 times a day with a [employer]Med sinus rinse kit and distilled water.  Do not use tap water.  You can use plain over-the-counter Mucinex every 6 hours to break up the stickiness of the mucus so your body can clear it.  Increase your oral fluid intake to thin out your mucus so that is also able for your body to clear more easily.  Take an over-the-counter probiotic, such as Culturelle-align-activia, 1 hour after each dose of antibiotic to prevent diarrhea.  Gargle with warm salt water 2-3 times a day to soothe your throat, aid in pain relief, and aid in healing.  You can also use Chloraseptic or Sucrets lozenges, 1 lozenge every 2 hours as needed for throat pain.  Take an over-the-counter probiotic 1 hour after each dose of antibiotic to prevent diarrhea.  Use over-the-counter Tylenol  and ibuprofen  as needed for pain or fever.  Place a hot water bottle, or heating pad, underneath your pillowcase at night to help dilate up your ear and aid in pain relief as well as resolution of the infection.  Return for reevaluation for any new or worsening symptoms.

## 2024-04-26 NOTE — ED Triage Notes (Signed)
 Pt is with her children  Pt c/o SOB, sore throat, worsening cough x2weeks  Pt states that she was around her children who were sick   Pt has taken OTC medication and it has not helped.   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

## 2024-08-17 ENCOUNTER — Ambulatory Visit
Admission: EM | Admit: 2024-08-17 | Discharge: 2024-08-17 | Payer: Self-pay | Attending: Family Medicine | Admitting: Family Medicine

## 2024-08-17 ENCOUNTER — Encounter: Payer: Self-pay | Admitting: Emergency Medicine

## 2024-08-17 DIAGNOSIS — M549 Dorsalgia, unspecified: Secondary | ICD-10-CM | POA: Diagnosis not present

## 2024-08-17 DIAGNOSIS — M542 Cervicalgia: Secondary | ICD-10-CM | POA: Diagnosis not present

## 2024-08-17 DIAGNOSIS — R519 Headache, unspecified: Secondary | ICD-10-CM | POA: Diagnosis not present

## 2024-08-17 NOTE — Discharge Instructions (Signed)
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
## 2024-08-26 ENCOUNTER — Other Ambulatory Visit: Payer: Self-pay

## 2024-08-26 ENCOUNTER — Emergency Department

## 2024-08-26 ENCOUNTER — Emergency Department: Admission: EM | Admit: 2024-08-26 | Discharge: 2024-08-26 | Disposition: A | Discharge status: Home / Self Care

## 2024-08-26 DIAGNOSIS — M545 Low back pain, unspecified: Secondary | ICD-10-CM

## 2024-08-26 DIAGNOSIS — D72829 Elevated white blood cell count, unspecified: Secondary | ICD-10-CM | POA: Insufficient documentation

## 2024-08-26 DIAGNOSIS — M5126 Other intervertebral disc displacement, lumbar region: Secondary | ICD-10-CM | POA: Insufficient documentation

## 2024-08-26 LAB — CBC WITH DIFFERENTIAL/PLATELET
Abs Immature Granulocytes: 0.1 K/uL — ABNORMAL HIGH (ref 0.00–0.07)
Basophils Absolute: 0.1 K/uL (ref 0.0–0.1)
Basophils Relative: 1 %
Eosinophils Absolute: 0.1 K/uL (ref 0.0–0.5)
Eosinophils Relative: 1 %
HCT: 41 % (ref 36.0–46.0)
Hemoglobin: 13.5 g/dL (ref 12.0–15.0)
Immature Granulocytes: 1 %
Lymphocytes Relative: 46 %
Lymphs Abs: 7 K/uL — ABNORMAL HIGH (ref 0.7–4.0)
MCH: 27.8 pg (ref 26.0–34.0)
MCHC: 32.9 g/dL (ref 30.0–36.0)
MCV: 84.4 fL (ref 80.0–100.0)
Monocytes Absolute: 1 K/uL (ref 0.1–1.0)
Monocytes Relative: 7 %
Neutro Abs: 6.4 K/uL (ref 1.7–7.7)
Neutrophils Relative %: 44 %
Platelets: 277 K/uL (ref 150–400)
RBC: 4.86 MIL/uL (ref 3.87–5.11)
RDW: 14.8 % (ref 11.5–15.5)
Smear Review: NORMAL
WBC: 14.8 K/uL — ABNORMAL HIGH (ref 4.0–10.5)
nRBC: 0 % (ref 0.0–0.2)

## 2024-08-26 LAB — POC URINE PREG, ED: Preg Test, Ur: NEGATIVE

## 2024-08-26 LAB — BASIC METABOLIC PANEL WITH GFR
Anion gap: 12 (ref 5–15)
BUN: 12 mg/dL (ref 6–20)
CO2: 23 mmol/L (ref 22–32)
Calcium: 9 mg/dL (ref 8.9–10.3)
Chloride: 105 mmol/L (ref 98–111)
Creatinine, Ser: 0.59 mg/dL (ref 0.44–1.00)
GFR, Estimated: >60 mL/min (ref >60)
Glucose, Bld: 102 mg/dL — ABNORMAL HIGH (ref 70–99)
Potassium: 4.2 mmol/L (ref 3.5–5.1)
Sodium: 140 mmol/L (ref 135–145)

## 2024-08-26 LAB — URINALYSIS, COMPLETE (UACMP) WITH MICROSCOPIC
Bacteria, UA: NONE SEEN
Bilirubin Urine: NEGATIVE
Glucose, UA: NEGATIVE mg/dL
Hgb urine dipstick: NEGATIVE
Ketones, ur: NEGATIVE mg/dL
Nitrite: NEGATIVE
Protein, ur: 30 mg/dL — AB
Specific Gravity, Urine: 1.024 (ref 1.005–1.030)
pH: 6 (ref 5.0–8.0)

## 2024-08-26 MED ORDER — KETOROLAC TROMETHAMINE 15 MG/ML IJ SOLN
15.0000 mg | Freq: Once | INTRAMUSCULAR | Status: AC
  Administered 2024-08-26: 15 mg via INTRAVENOUS
  Filled 2024-08-26: qty 1

## 2024-08-26 MED ORDER — OXYCODONE-ACETAMINOPHEN 5-325 MG PO TABS: 1.0000 | ORAL_TABLET | Freq: Four times a day (QID) | ORAL | 0 refills | Status: AC | PRN

## 2024-08-26 MED ORDER — METHOCARBAMOL 500 MG PO TABS
750.0000 mg | ORAL_TABLET | Freq: Once | ORAL | Status: AC
  Administered 2024-08-26: 750 mg via ORAL
  Filled 2024-08-26: qty 2

## 2024-08-26 MED ORDER — METHOCARBAMOL 500 MG PO TABS: 500.0000 mg | ORAL_TABLET | Freq: Four times a day (QID) | ORAL | 0 refills | Status: AC

## 2024-08-26 MED ORDER — MORPHINE SULFATE (PF) 4 MG/ML IV SOLN
4.0000 mg | Freq: Once | INTRAVENOUS | Status: AC
  Administered 2024-08-26: 4 mg via INTRAVENOUS
  Filled 2024-08-26: qty 1

## 2024-08-26 MED ORDER — SODIUM CHLORIDE 0.9 % IV BOLUS
1000.0000 mL | Freq: Once | INTRAVENOUS | Status: AC
  Administered 2024-08-26: 1000 mL via INTRAVENOUS
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
## 2024-08-26 NOTE — Discharge Instructions (Signed)
 1) for the next 2 weeks, do not lift anything heavier than 10 pounds.  Use your medications as prescribed.  Follow-up with your primary care physician and make an appointment with the neurosurgery team.  Return with any acutely worsening symptoms. -- RETURN PRECAUTIONS & AFTERCARE: (ENGLISH) RETURN PRECAUTIONS: Return immediately to the emergency department or see/call your doctor if you feel worse, weak or have changes in speech or vision, are short of breath, have fever, vomiting, pain, bleeding or dark stool, trouble urinating or any new issues. Return here or see/call your doctor if not improving as expected for your suspected condition. FOLLOW-UP CARE: Call your doctor and/or any doctors we referred you to for more advice and to make an appointment. Do this today, tomorrow or after the weekend. Some doctors only take PPO insurance so if you have HMO insurance you may want to contact your HMO or your regular doctor for referral to a specialist within your plan. Either way tell the doctor's office that it was a referral from the emergency department so you get the soonest possible appointment.  YOUR TEST RESULTS: Take result reports of any blood or urine tests, imaging tests and EKG's to your doctor and any referral doctor. Have any abnormal tests repeated. Your doctor or a referral doctor can let you know when this should be done. Also make sure your doctor contacts this hospital to get any test results that are not currently available such as cultures or special tests for infection and final imaging reports, which are often not available at the time you leave the ER but which may list additional important findings that are not documented on the preliminary report. BLOOD PRESSURE: If your blood pressure was greater than 120/80 have your blood pressure rechecked within 1 to 2 weeks. MEDICATION SIDE EFFECTS: Do not drive, walk, bike, take the bus, etc. if you have received or are being prescribed any  sedating medications such as those for pain or anxiety or certain antihistamines like Benadryl . If you have been give one of these here get a taxi home or have a friend drive you home. Ask your pharmacist to counsel you on potential side effects of any new medication
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
## 2024-10-09 ENCOUNTER — Encounter: Payer: Self-pay | Admitting: Emergency Medicine

## 2024-10-09 ENCOUNTER — Ambulatory Visit
Admission: EM | Admit: 2024-10-09 | Discharge: 2024-10-09 | Disposition: A | Discharge status: Home / Self Care | Attending: Physician Assistant | Admitting: Physician Assistant

## 2024-10-09 DIAGNOSIS — R0981 Nasal congestion: Secondary | ICD-10-CM

## 2024-10-09 DIAGNOSIS — R051 Acute cough: Secondary | ICD-10-CM

## 2024-10-09 DIAGNOSIS — J069 Acute upper respiratory infection, unspecified: Secondary | ICD-10-CM

## 2024-10-09 LAB — POC COVID19/FLU A&B COMBO
Covid Antigen, POC: NEGATIVE
Influenza A Antigen, POC: NEGATIVE
Influenza B Antigen, POC: NEGATIVE

## 2024-10-09 MED ORDER — PROMETHAZINE-DM 6.25-15 MG/5ML PO SYRP: 5.0000 mL | ORAL_SOLUTION | Freq: Four times a day (QID) | ORAL | 0 refills | Status: AC | PRN

## 2024-10-09 MED ORDER — IPRATROPIUM BROMIDE 0.06 % NA SOLN: 2.0000 | Freq: Four times a day (QID) | NASAL | 0 refills | Status: AC

## 2024-10-09 NOTE — Discharge Instructions (Addendum)
-  Negative COVID and flu testing.  URI/COLD SYMPTOMS: Your exam today is consistent with a viral illness. Antibiotics are not indicated at this time. Use medications as directed, including cough syrup, nasal saline, and decongestants. Your symptoms should improve over the next few days and resolve within 7-10 days. Increase rest and fluids. F/u if symptoms worsen or predominate such as sore throat, ear pain, productive cough, shortness of breath, or if you develop high fevers or worsening fatigue over the next several days.

## 2024-10-09 NOTE — ED Triage Notes (Signed)
Patient c/o cough, congestion and bodyaches that started yesterday. Patient denies fevers.
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
